# Patient Record
Sex: Female | Born: 1955 | Race: Black or African American | Hispanic: No | Smoking: Former smoker
Health system: Southern US, Community
[De-identification: ages and names within clinical notes are randomized; demographics above are authoritative.]

## PROBLEM LIST (undated history)

## (undated) DIAGNOSIS — F32A Depression, unspecified: Secondary | ICD-10-CM

## (undated) DIAGNOSIS — M1990 Unspecified osteoarthritis, unspecified site: Secondary | ICD-10-CM

## (undated) DIAGNOSIS — J302 Other seasonal allergic rhinitis: Secondary | ICD-10-CM

## (undated) DIAGNOSIS — C50919 Malignant neoplasm of unspecified site of unspecified female breast: Secondary | ICD-10-CM

## (undated) DIAGNOSIS — F419 Anxiety disorder, unspecified: Secondary | ICD-10-CM

## (undated) DIAGNOSIS — I10 Essential (primary) hypertension: Secondary | ICD-10-CM

## (undated) DIAGNOSIS — K219 Gastro-esophageal reflux disease without esophagitis: Secondary | ICD-10-CM

## (undated) DIAGNOSIS — F329 Major depressive disorder, single episode, unspecified: Secondary | ICD-10-CM

## (undated) HISTORY — DX: Unspecified osteoarthritis, unspecified site: M19.90

## (undated) HISTORY — DX: Malignant neoplasm of unspecified site of unspecified female breast: C50.919

## (undated) HISTORY — PX: KNEE ARTHROSCOPY: SUR90

## (undated) HISTORY — PX: BACK SURGERY: SHX140

## (undated) HISTORY — DX: Depression, unspecified: F32.A

## (undated) HISTORY — DX: Gastro-esophageal reflux disease without esophagitis: K21.9

## (undated) HISTORY — DX: Other seasonal allergic rhinitis: J30.2

## (undated) HISTORY — DX: Anxiety disorder, unspecified: F41.9

## (undated) HISTORY — DX: Essential (primary) hypertension: I10

## (undated) HISTORY — DX: Major depressive disorder, single episode, unspecified: F32.9

---

## 1991-06-25 HISTORY — PX: OTHER SURGICAL HISTORY: SHX169

## 1996-06-24 HISTORY — PX: VAGINAL HYSTERECTOMY: SHX2639

## 1998-05-09 ENCOUNTER — Encounter: Admission: RE | Admit: 1998-05-09 | Discharge: 1998-07-28 | Payer: Self-pay | Admitting: Anesthesiology

## 1998-07-28 ENCOUNTER — Encounter: Admission: RE | Admit: 1998-07-28 | Discharge: 1998-10-26 | Payer: Self-pay | Admitting: Anesthesiology

## 1999-01-05 ENCOUNTER — Encounter: Admission: RE | Admit: 1999-01-05 | Discharge: 1999-01-22 | Payer: Self-pay | Admitting: Anesthesiology

## 1999-01-23 ENCOUNTER — Encounter: Admission: RE | Admit: 1999-01-23 | Discharge: 1999-04-23 | Payer: Self-pay | Admitting: Anesthesiology

## 1999-05-11 ENCOUNTER — Encounter: Admission: RE | Admit: 1999-05-11 | Discharge: 1999-07-06 | Payer: Self-pay | Admitting: Anesthesiology

## 1999-07-06 ENCOUNTER — Encounter: Admission: RE | Admit: 1999-07-06 | Discharge: 1999-09-14 | Payer: Self-pay | Admitting: Anesthesiology

## 2001-05-18 ENCOUNTER — Ambulatory Visit (HOSPITAL_COMMUNITY): Admission: RE | Admit: 2001-05-18 | Discharge: 2001-05-18 | Payer: Self-pay | Admitting: Internal Medicine

## 2001-12-31 ENCOUNTER — Ambulatory Visit (HOSPITAL_COMMUNITY): Admission: RE | Admit: 2001-12-31 | Discharge: 2001-12-31 | Payer: Self-pay | Admitting: Family Medicine

## 2001-12-31 ENCOUNTER — Encounter: Payer: Self-pay | Admitting: Family Medicine

## 2002-04-30 ENCOUNTER — Ambulatory Visit (HOSPITAL_COMMUNITY): Admission: RE | Admit: 2002-04-30 | Discharge: 2002-04-30 | Payer: Self-pay | Admitting: Family Medicine

## 2002-04-30 ENCOUNTER — Encounter: Payer: Self-pay | Admitting: Family Medicine

## 2005-02-18 ENCOUNTER — Ambulatory Visit: Payer: Self-pay | Admitting: Cardiology

## 2006-01-16 ENCOUNTER — Encounter: Admission: RE | Admit: 2006-01-16 | Discharge: 2006-01-16 | Payer: Self-pay | Admitting: Orthopedic Surgery

## 2008-06-24 HISTORY — PX: TRIGGER FINGER RELEASE: SHX641

## 2009-06-24 DIAGNOSIS — C50919 Malignant neoplasm of unspecified site of unspecified female breast: Secondary | ICD-10-CM

## 2009-06-24 HISTORY — PX: BREAST LUMPECTOMY: SHX2

## 2009-06-24 HISTORY — DX: Malignant neoplasm of unspecified site of unspecified female breast: C50.919

## 2010-04-11 ENCOUNTER — Encounter: Admission: RE | Admit: 2010-04-11 | Discharge: 2010-04-11 | Payer: Self-pay | Admitting: Family Medicine

## 2010-04-17 ENCOUNTER — Encounter: Admission: RE | Admit: 2010-04-17 | Discharge: 2010-04-17 | Payer: Self-pay | Admitting: Family Medicine

## 2010-06-28 ENCOUNTER — Ambulatory Visit
Admission: RE | Admit: 2010-06-28 | Discharge: 2010-07-24 | Payer: Self-pay | Source: Home / Self Care | Attending: Radiation Oncology | Admitting: Radiation Oncology

## 2010-07-25 ENCOUNTER — Ambulatory Visit: Payer: Self-pay | Admitting: Radiation Oncology

## 2010-08-06 ENCOUNTER — Ambulatory Visit: Payer: MEDICARE | Attending: Radiation Oncology | Admitting: Radiation Oncology

## 2010-08-06 DIAGNOSIS — C50919 Malignant neoplasm of unspecified site of unspecified female breast: Secondary | ICD-10-CM | POA: Insufficient documentation

## 2010-11-09 NOTE — Op Note (Signed)
[facility]  Patient:    Glenda [name], Glenda [name] Visit Number: [id number] MRN: [id number]          Service Type: END Location: DAY Attending Physician:  Jonathon Bellows Dictated by:   Roetta Sessions, M.D. Proc. Date: 05/18/01 Admit Date:  05/18/2001   CC:         Lucita Lora. Reuel Boom, M.D., [facility], [place], [place]   Operative Report  PROCEDURE:  Esophagogastroduodenoscopy with [facility] dilation.  ENDOSCOPIST:  Roetta Sessions, M.D.  INDICATION FOR PROCEDURE:  Patient is a 55 year old lady referred by Dr. Lucita Lora. Daniel to further evaluate esophageal dysphagia.  She has some reflux symptoms that are well-controlled on Protonix 40 mg orally daily.  EGD is now being done to further evaluate her dysphagia.  This approach has been discussed with Ms. Rosalie Gums previously and again today at the bedside.  The potential risks, benefits and alternatives have been reviewed, questions answered and she is agreeable.  Please see my dictated consultation note for information.  DESCRIPTION OF PROCEDURE:  O2 saturation, blood pressure, pulse and respirations were monitored throughout the entire procedure.  Conscious sedation:  Versed 4 mg IV, Demerol 75 mg IV in divided doses; Cetacaine Spray for topical oropharyngeal anesthesia.  INSTRUMENT:  Olympus diagnostic gastroscope.  FINDINGS:  Examination of the tubular esophagus revealed no abnormalities.  Stomach:  The gastric cavity was empty and insufflated well with air. Thorough examination of the gastric mucosa including a retroflexed view of the proximal stomach and esophagogastric junction demonstrated no abnormalities. Pyloric channel was patent and easily traversed.  Duodenum:  The bulb and second portion were well-seen and there were some posterior bulbar erosions but otherwise the mucosa through the second portion appeared normal.  THERAPEUTIC/DIAGNOSTIC MANEUVERS PERFORMED:  A 54-French Maloney dilator  was passed to full insertion with good patient tolerance.  There was distinct "pop" felt as the dilator was passed across the cricopharyngeus.  A look back revealed a slight amount of blood at the cricopharyngeus.  There was no mucosal tear.  Remainder of the esophagus and proximal stomach appeared normal.  The patient tolerated the procedure well and was reactive at endoscopy.  IMPRESSION:  Normal tubular esophagus.   Normal stomach. Posterior bulbar erosions of uncertain significance.  Remainder of bulb and second portion appeared normal.  Status post dilation as described above.  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  RECOMMENDATIONS: 1. Continue Protonix 40 mg daily for now. 2. We are going to draw H. pylori serologies. 3. Appointment to see [place] back in the office to assess her progress in four    weeks. Dictated by:   Roetta Sessions, M.D. Attending Physician:  Jonathon Bellows DD:  05/18/01 TD:  05/18/01 Job: [id number]

## 2011-06-25 HISTORY — PX: SHOULDER SURGERY: SHX246

## 2011-09-10 ENCOUNTER — Ambulatory Visit: Payer: Medicare Other | Attending: Orthopedic Surgery | Admitting: Physical Therapy

## 2011-09-10 DIAGNOSIS — M25519 Pain in unspecified shoulder: Secondary | ICD-10-CM | POA: Insufficient documentation

## 2011-09-10 DIAGNOSIS — IMO0001 Reserved for inherently not codable concepts without codable children: Secondary | ICD-10-CM | POA: Insufficient documentation

## 2011-09-10 DIAGNOSIS — M25619 Stiffness of unspecified shoulder, not elsewhere classified: Secondary | ICD-10-CM | POA: Insufficient documentation

## 2011-09-12 ENCOUNTER — Ambulatory Visit: Payer: Medicare Other | Admitting: Physical Therapy

## 2011-09-16 ENCOUNTER — Ambulatory Visit: Payer: Medicare Other | Admitting: Physical Therapy

## 2011-09-18 ENCOUNTER — Ambulatory Visit: Payer: Medicare Other | Admitting: Physical Therapy

## 2011-09-23 ENCOUNTER — Ambulatory Visit: Payer: Medicare Other | Attending: Orthopedic Surgery | Admitting: Physical Therapy

## 2011-09-23 DIAGNOSIS — IMO0001 Reserved for inherently not codable concepts without codable children: Secondary | ICD-10-CM | POA: Insufficient documentation

## 2011-09-23 DIAGNOSIS — M25619 Stiffness of unspecified shoulder, not elsewhere classified: Secondary | ICD-10-CM | POA: Insufficient documentation

## 2011-09-23 DIAGNOSIS — M25519 Pain in unspecified shoulder: Secondary | ICD-10-CM | POA: Insufficient documentation

## 2011-09-24 ENCOUNTER — Ambulatory Visit: Payer: Medicare Other | Admitting: Physical Therapy

## 2011-09-30 ENCOUNTER — Encounter: Payer: Medicare Other | Admitting: Physical Therapy

## 2011-10-02 ENCOUNTER — Encounter: Payer: Medicare Other | Admitting: Physical Therapy

## 2011-10-08 ENCOUNTER — Encounter: Payer: Medicare Other | Admitting: Physical Therapy

## 2011-10-09 ENCOUNTER — Encounter: Payer: Medicare Other | Admitting: Physical Therapy

## 2011-10-10 ENCOUNTER — Encounter: Payer: Self-pay | Admitting: Internal Medicine

## 2011-11-20 ENCOUNTER — Ambulatory Visit (AMBULATORY_SURGERY_CENTER): Payer: Medicare Other | Admitting: *Deleted

## 2011-11-20 ENCOUNTER — Encounter: Payer: Self-pay | Admitting: Internal Medicine

## 2011-11-20 VITALS — Ht 64.0 in | Wt 180.2 lb

## 2011-11-20 DIAGNOSIS — Z1211 Encounter for screening for malignant neoplasm of colon: Secondary | ICD-10-CM

## 2011-11-20 MED ORDER — PEG-KCL-NACL-NASULF-NA ASC-C 100 G PO SOLR: 1.0000 | Freq: Once | ORAL | Status: DC

## 2011-12-04 ENCOUNTER — Encounter: Payer: Self-pay | Admitting: Internal Medicine

## 2011-12-04 ENCOUNTER — Ambulatory Visit (AMBULATORY_SURGERY_CENTER): Payer: Medicare Other | Admitting: Internal Medicine

## 2011-12-04 VITALS — BP 126/67 | HR 68 | Temp 98.7°F | Resp 18 | Ht 64.0 in | Wt 180.0 lb

## 2011-12-04 DIAGNOSIS — Z1211 Encounter for screening for malignant neoplasm of colon: Secondary | ICD-10-CM

## 2011-12-04 MED ORDER — SODIUM CHLORIDE 0.9 % IV SOLN: 500.0000 mL | INTRAVENOUS | Status: AC

## 2011-12-04 NOTE — Progress Notes (Signed)
Patient did not experience any of the following events: a burn prior to discharge; a fall within the facility; wrong site/side/patient/procedure/implant event; or a hospital transfer or hospital admission upon discharge from the facility. (G8907) Patient did not have preoperative order for IV antibiotic SSI prophylaxis. (G8918)  

## 2011-12-04 NOTE — Patient Instructions (Addendum)
Discharge instructions given with verbal understanding. Resume previous medications.YOU HAD AN ENDOSCOPIC PROCEDURE TODAY AT THE [facility]: Refer to the procedure report that was given to you for any specific questions about what was found during the examination.  If the procedure report does not answer your questions, please call your gastroenterologist to clarify.  If you requested that your care partner not be given the details of your procedure findings, then the procedure report has been included in a sealed envelope for you to review at your convenience later.  YOU SHOULD EXPECT: Some feelings of bloating in the abdomen. Passage of more gas than usual.  Walking can help get rid of the air that was put into your GI tract during the procedure and reduce the bloating. If you had a lower endoscopy (such as a colonoscopy or flexible sigmoidoscopy) you may notice spotting of blood in your stool or on the toilet paper. If you underwent a bowel prep for your procedure, then you may not have a normal bowel movement for a few days.  DIET: Your first meal following the procedure should be a light meal and then it is ok to progress to your normal diet.  A half-sandwich or bowl of soup is an example of a good first meal.  Heavy or fried foods are harder to digest and may make you feel nauseous or bloated.  Likewise meals heavy in dairy and vegetables can cause extra gas to form and this can also increase the bloating.  Drink plenty of fluids but you should avoid alcoholic beverages for 24 hours.  ACTIVITY: Your care partner should take you home directly after the procedure.  You should plan to take it easy, moving slowly for the rest of the day.  You can resume normal activity the day after the procedure however you should NOT DRIVE or use heavy machinery for 24 hours (because of the sedation medicines used during the test).    SYMPTOMS TO REPORT IMMEDIATELY: A gastroenterologist can be reached at  any hour.  During normal business hours, 8:30 AM to 5:00 PM Monday through Friday, call [phone].  After hours and on weekends, please call the GI answering service at [phone] who will take a message and have the physician on call contact you.   Following lower endoscopy (colonoscopy or flexible sigmoidoscopy):  Excessive amounts of blood in the stool  Significant tenderness or worsening of abdominal pains  Swelling of the abdomen that is new, acute  Fever of 100F or higher  FOLLOW UP: If any biopsies were taken you will be contacted by phone or by letter within the next 1-3 weeks.  Call your gastroenterologist if you have not heard about the biopsies in 3 weeks.  Our staff will call the home number listed on your records the next business day following your procedure to check on you and address any questions or concerns that you may have at that time regarding the information given to you following your procedure. This is a courtesy call and so if there is no answer at the home number and we have not heard from you through the emergency physician on call, we will assume that you have returned to your regular daily activities without incident.  SIGNATURES/CONFIDENTIALITY: You and/or your care partner have signed paperwork which will be entered into your electronic medical record.  These signatures attest to the fact that that the information above on your After Visit Summary has been reviewed and is understood.    Full responsibility of the confidentiality of this discharge information lies with you and/or your care-partner.  

## 2011-12-04 NOTE — Op Note (Signed)
[facility] [address] [place], [place]  [address]  COLONOSCOPY PROCEDURE REPORT  PATIENT:  Glenda, [name]  MR#:  [id number] BIRTHDATE:  [birth year], 55 yrs. old  GENDER:  female ENDOSCOPIST:  Hedwig Morton. Juanda Chance, MD REF. BY:  Donzetta Sprung, M.D. PROCEDURE DATE:  12/04/2011 PROCEDURE:  Colonoscopy 72536 ASA CLASS:  Class I INDICATIONS:  colorectal cancer screening, average risk MEDICATIONS:   MAC sedation, administered by CRNA, propofol (Diprivan) 150 mg  DESCRIPTION OF PROCEDURE:   After the risks and benefits and of the procedure were explained, informed consent was obtained. Digital rectal exam was performed and revealed no rectal masses. The LB [id number] endoscope was introduced through the anus and advanced to the cecum, which was identified by both the appendix and ileocecal valve.  The quality of the prep was good, using MoviPrep.  The instrument was then slowly withdrawn as the colon was fully examined. <<PROCEDUREIMAGES>>  FINDINGS:  No polyps or cancers were seen (see image1, image2, image4, image5, and image6).   Retroflexed views in the rectum revealed no abnormalities.    The scope was then withdrawn from the patient and the procedure completed.  COMPLICATIONS:  None ENDOSCOPIC IMPRESSION: 1) No polyps or cancers 2) Normal colonoscopy RECOMMENDATIONS: 1) High fiber diet.  REPEAT EXAM:  In 10 year(s) for.  ______________________________ Hedwig Morton. Juanda Chance, MD  CC:  n. eSIGNED:   Hedwig Morton. Medhansh Brinkmeier at 12/04/2011 10:00 AM  Stacy Gardner, [phone]

## 2011-12-05 ENCOUNTER — Telehealth: Payer: Self-pay | Admitting: *Deleted

## 2011-12-05 NOTE — Telephone Encounter (Signed)
  Follow up Call-  Call back number 12/04/2011  Post procedure Call Back phone  # [phone]  Permission to leave phone message Yes     Patient questions:  Do you have a fever, pain , or abdominal swelling? no Pain Score  0 *  Have you tolerated food without any problems? yes  Have you been able to return to your normal activities? yes  Do you have any questions about your discharge instructions: Diet   no Medications  no Follow up visit  no  Do you have questions or concerns about your Care? no  Actions: * If pain score is 4 or above: No action needed, pain <4.

## 2012-04-14 ENCOUNTER — Encounter (INDEPENDENT_AMBULATORY_CARE_PROVIDER_SITE_OTHER): Payer: Medicare Other | Admitting: Internal Medicine

## 2012-04-14 DIAGNOSIS — M949 Disorder of cartilage, unspecified: Secondary | ICD-10-CM

## 2012-04-14 DIAGNOSIS — C50919 Malignant neoplasm of unspecified site of unspecified female breast: Secondary | ICD-10-CM

## 2012-04-14 DIAGNOSIS — M899 Disorder of bone, unspecified: Secondary | ICD-10-CM

## 2012-04-14 DIAGNOSIS — Z79810 Long term (current) use of selective estrogen receptor modulators (SERMs): Secondary | ICD-10-CM

## 2012-10-07 ENCOUNTER — Encounter (INDEPENDENT_AMBULATORY_CARE_PROVIDER_SITE_OTHER): Payer: Medicare Other | Admitting: Internal Medicine

## 2012-10-07 DIAGNOSIS — M899 Disorder of bone, unspecified: Secondary | ICD-10-CM

## 2012-10-07 DIAGNOSIS — M949 Disorder of cartilage, unspecified: Secondary | ICD-10-CM

## 2012-10-07 DIAGNOSIS — C50919 Malignant neoplasm of unspecified site of unspecified female breast: Secondary | ICD-10-CM

## 2013-04-15 ENCOUNTER — Encounter (INDEPENDENT_AMBULATORY_CARE_PROVIDER_SITE_OTHER): Payer: Medicare Other

## 2013-04-15 DIAGNOSIS — M949 Disorder of cartilage, unspecified: Secondary | ICD-10-CM

## 2013-04-15 DIAGNOSIS — M899 Disorder of bone, unspecified: Secondary | ICD-10-CM

## 2013-04-15 DIAGNOSIS — C50919 Malignant neoplasm of unspecified site of unspecified female breast: Secondary | ICD-10-CM

## 2013-06-04 ENCOUNTER — Encounter: Payer: Self-pay | Admitting: *Deleted

## 2013-06-04 ENCOUNTER — Telehealth: Payer: Self-pay | Admitting: *Deleted

## 2013-06-04 NOTE — Progress Notes (Signed)
Received information back from Dr. Darnelle Catalan and he stated to schedule her as normal and to see if Dr. Welton Flakes had availability to see her first.  Took paperwork to Dr. Welton Flakes for an appt date and time.

## 2013-06-04 NOTE — Progress Notes (Signed)
Received a referral from Tiffany and I have sent paperwork to Dr. Darnelle Catalan so I can schedule her appropriately.

## 2013-06-04 NOTE — Telephone Encounter (Signed)
Received an appt from Dr. Welton Flakes.  I called and confirmed 06/08/13 appt w/ pt.  Unable to make the [facility] appt so I called Dory Peru and she will make this one for me.  I mailed before appt letter, welcome packet & intake form to pt.  Took paperwork to Med [facility] for chart.

## 2013-06-07 ENCOUNTER — Encounter: Payer: Self-pay | Admitting: *Deleted

## 2013-06-07 ENCOUNTER — Other Ambulatory Visit: Payer: Self-pay | Admitting: *Deleted

## 2013-06-07 DIAGNOSIS — C50212 Malignant neoplasm of upper-inner quadrant of left female breast: Secondary | ICD-10-CM | POA: Insufficient documentation

## 2013-06-07 NOTE — Progress Notes (Signed)
Completed chart and gave to [facility] to enter labs and either give back to me or place in Dr. Milta Deiters box.

## 2013-06-07 NOTE — Progress Notes (Signed)
Received chart back from Dawn and placed in Dr. Khan's box. 

## 2013-06-08 ENCOUNTER — Ambulatory Visit: Payer: Self-pay

## 2013-06-08 ENCOUNTER — Ambulatory Visit (HOSPITAL_BASED_OUTPATIENT_CLINIC_OR_DEPARTMENT_OTHER): Payer: Medicare Other | Admitting: Oncology

## 2013-06-08 ENCOUNTER — Telehealth: Payer: Self-pay | Admitting: Oncology

## 2013-06-08 ENCOUNTER — Telehealth: Payer: Self-pay | Admitting: *Deleted

## 2013-06-08 ENCOUNTER — Other Ambulatory Visit (HOSPITAL_BASED_OUTPATIENT_CLINIC_OR_DEPARTMENT_OTHER): Payer: Medicare Other

## 2013-06-08 VITALS — BP 103/66 | HR 69 | Temp 98.3°F | Resp 20 | Ht 64.0 in | Wt 161.5 lb

## 2013-06-08 DIAGNOSIS — C50919 Malignant neoplasm of unspecified site of unspecified female breast: Secondary | ICD-10-CM

## 2013-06-08 DIAGNOSIS — M8580 Other specified disorders of bone density and structure, unspecified site: Secondary | ICD-10-CM

## 2013-06-08 DIAGNOSIS — C50912 Malignant neoplasm of unspecified site of left female breast: Secondary | ICD-10-CM

## 2013-06-08 DIAGNOSIS — Z170 Estrogen receptor positive status [ER+]: Secondary | ICD-10-CM

## 2013-06-08 DIAGNOSIS — C50219 Malignant neoplasm of upper-inner quadrant of unspecified female breast: Secondary | ICD-10-CM

## 2013-06-08 DIAGNOSIS — E559 Vitamin D deficiency, unspecified: Secondary | ICD-10-CM

## 2013-06-08 DIAGNOSIS — C50212 Malignant neoplasm of upper-inner quadrant of left female breast: Secondary | ICD-10-CM

## 2013-06-08 LAB — CBC WITH DIFFERENTIAL/PLATELET
HCT: 35.8 % (ref 34.8–46.6)
LYMPH%: 43.1 % (ref 14.0–49.7)
MCHC: 34 g/dL (ref 31.5–36.0)
MCV: 88.3 fL (ref 79.5–101.0)
MONO#: 0.3 10*3/uL (ref 0.1–0.9)
NEUT#: 2.1 10*3/uL (ref 1.5–6.5)
Platelets: 198 10*3/uL (ref 145–400)
RBC: 4.05 10*6/uL (ref 3.70–5.45)
RDW: 13.3 % (ref 11.2–14.5)
WBC: 4.4 10*3/uL (ref 3.9–10.3)
lymph#: 1.9 10*3/uL (ref 0.9–3.3)

## 2013-06-08 LAB — COMPREHENSIVE METABOLIC PANEL (CC13)
Albumin: 3.8 g/dL (ref 3.5–5.0)
Anion Gap: 7 mEq/L (ref 3–11)
BUN: 10.1 mg/dL (ref 7.0–26.0)
CO2: 27 mEq/L (ref 22–29)
Glucose: 100 mg/dl (ref 70–140)
Sodium: 141 mEq/L (ref 136–145)

## 2013-06-08 MED ORDER — EXEMESTANE 25 MG PO TABS: 25.0000 mg | ORAL_TABLET | Freq: Every day | ORAL | Status: DC

## 2013-06-08 NOTE — Telephone Encounter (Signed)
Called and spoke with patient and rescheduled her appt. For 1pm instead of 130pm.

## 2013-06-08 NOTE — Telephone Encounter (Signed)
, °

## 2013-06-08 NOTE — Patient Instructions (Signed)
Exemestane tablets  What is this medicine?  EXEMESTANE (ex e MES tane) blocks the production of the hormone estrogen. Some types of breast cancer depend on estrogen to grow, and this medicine can stop tumor growth by blocking estrogen production. This medicine is for the treatment of breast cancer in postmenopausal women only.  This medicine may be used for other purposes; ask your health care provider or pharmacist if you have questions.  COMMON BRAND NAME(S): Aromasin  What should I tell my health care provider before I take this medicine?  They need to know if you have any of these conditions:  -an unusual or allergic reaction to exemestane, other medicines, foods, dyes, or preservatives  -pregnant or trying to get pregnant  -breast-feeding  How should I use this medicine?  Take this medicine by mouth with a glass of water. Follow the directions on the prescription label. Take your doses at regular intervals after a meal. Do not take your medicine more often than directed. Do not stop taking except on the advice of your doctor or health care professional.  Contact your pediatrician regarding the use of this medicine in children. Special care may be needed.  Overdosage: If you think you have taken too much of this medicine contact a poison control center or emergency room at once.  NOTE: This medicine is only for you. Do not share this medicine with others.  What if I miss a dose?  If you miss a dose, take the next dose as usual. Do not try to make up the missed dose. Do not take double or extra doses.  What may interact with this medicine?  Do not take this medicine with any of the following medications:  -female hormones, like estrogens and birth control pills  This medicine may also interact with the following medications:  -androstenedione  -phenytoin  -rifabutin, rifampin, or rifapentine  -St. John's Wort  This list may not describe all possible interactions. Give your health care provider a list of all the  medicines, herbs, non-prescription drugs, or dietary supplements you use. Also tell them if you smoke, drink alcohol, or use illegal drugs. Some items may interact with your medicine.  What should I watch for while using this medicine?  Visit your doctor or health care professional for regular checks on your progress.  If you experience hot flashes or sweating while taking this medicine, avoid alcohol, smoking and drinks with caffeine. This may help to decrease these side effects.  What side effects may I notice from receiving this medicine?  Side effects that you should report to your doctor or health care professional as soon as possible:  -any new or unusual symptoms  -changes in vision  -fever  -leg or arm swelling  -pain in bones, joints, or muscles  -pain in hips, back, ribs, arms, shoulders, or legs  Side effects that usually do not require medical attention (report to your doctor or health care professional if they continue or are bothersome):  -difficulty sleeping  -headache  -hot flashes  -sweating  -unusually weak or tired  This list may not describe all possible side effects. Call your doctor for medical advice about side effects. You may report side effects to FDA at 1-800-FDA-1088.  Where should I keep my medicine?  Keep out of the reach of children.  Store at room temperature between 15 and 30 degrees C (59 and 86 degrees F). Throw away any unused medicine after the expiration date.  NOTE: This sheet   is a summary. It may not cover all possible information. If you have questions about this medicine, talk to your doctor, pharmacist, or health care provider.   2014, Elsevier/Gold Standard. (2007-10-13 11:48:29)

## 2013-06-11 NOTE — Progress Notes (Signed)
OFFICE PROGRESS NOTE  CCDonzetta Sprung, MD [address] 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 She then received radiation therapy by Dr. Kathrynn Running. Thereafter patient was begun on tamoxifen 20 mg daily. However she is also on Zoloft.    #2 patient had been followed at [facility] but now she is transferring her care to medical oncology.  CURRENT THERAPY:Tamoxifen  INTERVAL HISTORY: Glenda [name] 57 y.o. female returns for followup visit today. Overall she seems to be doing well she denies any fevers chills night sweats headaches shortness of breath chest pains palpitations .She denies any fevers chills night sweats headaches shortness of breath palpitations no myalgias and arthralgias no peripheral paresthesias no bleeding problems. Remainder of the 10 point review of systems is negative.  MEDICAL HISTORY: Past Medical History  Diagnosis Date  . Seasonal allergies   . Anxiety   . Arthritis     right shoulder  . Breast cancer 2011    left breast  . Depression   . GERD (gastroesophageal reflux disease)   . Hypertension     ALLERGIES:  is allergic to sulfa antibiotics.  MEDICATIONS:  Current Outpatient Prescriptions  Medication Sig Dispense Refill  . amLODipine-valsartan (EXFORGE) 5-160 MG per tablet Take 1 tablet by mouth daily.      . cetirizine (ZYRTEC) 10 MG tablet Take 10 mg by mouth daily as needed.      . Cyanocobalamin (VITAMIN B 12 PO) Take 500 mg by mouth daily.      . fluticasone (FLOVENT DISKUS) 50 MCG/BLIST diskus inhaler Inhale 2 puffs into the lungs daily.      Marland Kitchen HYDROcodone-acetaminophen (NORCO) 5-325 MG per tablet Take 1 tablet by mouth every 6 (six) hours as needed.      Marland Kitchen ibuprofen  (ADVIL,MOTRIN) 200 MG tablet Take 200 mg by mouth every 6 (six) hours as needed.      . ranitidine (ZANTAC) 150 MG capsule Take 150 mg by mouth daily as needed.      . sertraline (ZOLOFT) 100 MG tablet Take 100 mg by mouth 2 (two) times daily.      Marland Kitchen tiZANidine (ZANAFLEX) 4 MG tablet Take 4 mg by mouth every 6 (six) hours as needed.      . traZODone (DESYREL) 100 MG tablet Take 200 mg by mouth at bedtime.      Marland Kitchen exemestane (AROMASIN) 25 MG tablet Take 1 tablet (25 mg total) by mouth daily after breakfast.  90 tablet  12   Current Facility-Administered Medications  Medication Dose Route Frequency Provider Last Rate Last Dose  . 0.9 %  sodium chloride infusion  500 mL Intravenous Continuous Hart Carwin, MD        SURGICAL HISTORY:  Past Surgical History  Procedure Laterality Date  . Breast lumpectomy  2011  . Shoulder surgery  2013  . Vaginal hysterectomy  1998  . Trigger finger release  2010    left thumb  . Knee arthroscopy  2000, 1990    right and left  . Back surgery  1985, 1992  . Breast biopsy   1993    REVIEW OF SYSTEMS:  Pertinent items are noted in HPI.   HEALTH MAINTENANCE:   PHYSICAL EXAMINATION: Blood pressure 103/66, pulse 69, temperature 98.3 F (  36.8 C), temperature source Oral, resp. rate 20, height 5\' 4"  (1.626 m), weight 161 lb 8 oz (73.256 kg). Body mass index is 27.71 kg/(m^2). ECOG PERFORMANCE STATUS: 0 - Asymptomatic   General appearance: alert, cooperative and appears stated age Neck: no adenopathy, no carotid bruit, no JVD, supple, symmetrical, trachea midline and thyroid not enlarged, symmetric, no tenderness/mass/nodules Lymph nodes: Cervical, supraclavicular, and axillary nodes normal. Resp: clear to auscultation bilaterally Back: symmetric, no curvature. ROM normal. No CVA tenderness. Cardio: regular rate and rhythm GI: soft, non-tender; bowel sounds normal; no masses,  no organomegaly Extremities: extremities normal, atraumatic, no cyanosis or  edema Neurologic: Alert and oriented X 3, normal strength and tone. Normal symmetric reflexes. Normal coordination and gait   LABORATORY DATA: Lab Results  Component Value Date   WBC 4.4 06/08/2013   HGB 12.2 06/08/2013   HCT 35.8 06/08/2013   MCV 88.3 06/08/2013   PLT 198 06/08/2013      Chemistry      Component Value Date/Time   NA 141 06/08/2013 1333   K 4.2 06/08/2013 1333   CO2 27 06/08/2013 1333   BUN 10.1 06/08/2013 1333   CREATININE 0.8 06/08/2013 1333      Component Value Date/Time   CALCIUM 9.2 06/08/2013 1333   ALKPHOS 86 06/08/2013 1333   AST 25 06/08/2013 1333   ALT 13 06/08/2013 1333   BILITOT 0.31 06/08/2013 1333       RADIOGRAPHIC STUDIES:  No results found.  ASSESSMENT/PLAN:: 57 year old female with  #1 stage I (T1 and her 0) invasive ductal carcinoma ER positive. She has been on tamoxifen 20 mg daily plan is for 5 years. However patient is on Zoloft also. She is questioning from drug interaction. I have recommended we switched to Aromasin since patient is postmenopausal.  #2 I will order patient's mammogram as well as her bone density scan.  #3 patient will be seen back in 6 months time for followup     All questions were answered. The patient knows to call the clinic with any problems, questions or concerns. We can certainly see the patient much sooner if necessary.  I spent 40 minutes counseling the patient face to face. The total time spent in the appointment was 40 minutes.    Drue Second, MD Medical/Oncology [facility] [phone] (beeper) [phone] (Office)  06/11/2013, 3:44 AM

## 2013-07-13 ENCOUNTER — Ambulatory Visit
Admission: RE | Admit: 2013-07-13 | Discharge: 2013-07-13 | Disposition: A | Discharge status: Home / Self Care | Payer: Medicare Other | Source: Ambulatory Visit | Attending: Oncology | Admitting: Oncology

## 2013-07-13 DIAGNOSIS — M8580 Other specified disorders of bone density and structure, unspecified site: Secondary | ICD-10-CM

## 2013-07-13 DIAGNOSIS — C50912 Malignant neoplasm of unspecified site of left female breast: Secondary | ICD-10-CM

## 2013-07-13 MED ORDER — ALENDRONATE SODIUM 35 MG PO TABS: 35.0000 mg | ORAL_TABLET | ORAL | Status: DC

## 2013-07-13 NOTE — Progress Notes (Signed)
Quick Note:  Please call patient:   Bone density shows low bone mass   begin fosamax 1 q weekly  Prescription sent to her pharamcy ______

## 2013-10-12 ENCOUNTER — Telehealth: Payer: Self-pay | Admitting: Oncology

## 2013-10-12 NOTE — Telephone Encounter (Signed)
, °

## 2013-10-20 ENCOUNTER — Other Ambulatory Visit: Payer: Self-pay

## 2013-10-20 ENCOUNTER — Ambulatory Visit: Payer: Self-pay | Admitting: Oncology

## 2013-10-28 ENCOUNTER — Telehealth: Payer: Self-pay | Admitting: Hematology and Oncology

## 2013-10-28 ENCOUNTER — Ambulatory Visit (HOSPITAL_BASED_OUTPATIENT_CLINIC_OR_DEPARTMENT_OTHER): Payer: Medicare Other | Admitting: Hematology and Oncology

## 2013-10-28 ENCOUNTER — Other Ambulatory Visit (HOSPITAL_BASED_OUTPATIENT_CLINIC_OR_DEPARTMENT_OTHER): Payer: Medicare Other

## 2013-10-28 VITALS — BP 114/69 | HR 68 | Temp 98.6°F | Resp 18 | Ht 64.0 in | Wt 167.1 lb

## 2013-10-28 DIAGNOSIS — Z170 Estrogen receptor positive status [ER+]: Secondary | ICD-10-CM

## 2013-10-28 DIAGNOSIS — E559 Vitamin D deficiency, unspecified: Secondary | ICD-10-CM

## 2013-10-28 DIAGNOSIS — M2550 Pain in unspecified joint: Secondary | ICD-10-CM

## 2013-10-28 DIAGNOSIS — M899 Disorder of bone, unspecified: Secondary | ICD-10-CM

## 2013-10-28 DIAGNOSIS — C50212 Malignant neoplasm of upper-inner quadrant of left female breast: Secondary | ICD-10-CM

## 2013-10-28 DIAGNOSIS — N951 Menopausal and female climacteric states: Secondary | ICD-10-CM

## 2013-10-28 DIAGNOSIS — C50919 Malignant neoplasm of unspecified site of unspecified female breast: Secondary | ICD-10-CM

## 2013-10-28 DIAGNOSIS — M949 Disorder of cartilage, unspecified: Secondary | ICD-10-CM

## 2013-10-28 DIAGNOSIS — C50912 Malignant neoplasm of unspecified site of left female breast: Secondary | ICD-10-CM

## 2013-10-28 LAB — COMPREHENSIVE METABOLIC PANEL (CC13)
ALK PHOS: 101 U/L (ref 40–150)
ALT: 12 U/L (ref 0–55)
AST: 24 U/L (ref 5–34)
Albumin: 3.8 g/dL (ref 3.5–5.0)
Anion Gap: 8 mEq/L (ref 3–11)
BUN: 8.1 mg/dL (ref 7.0–26.0)
CHLORIDE: 109 meq/L (ref 98–109)
CO2: 26 meq/L (ref 22–29)
CREATININE: 0.8 mg/dL (ref 0.6–1.1)
Calcium: 9.8 mg/dL (ref 8.4–10.4)
Glucose: 95 mg/dl (ref 70–140)
Potassium: 4.3 mEq/L (ref 3.5–5.1)
Sodium: 143 mEq/L (ref 136–145)
TOTAL PROTEIN: 7.1 g/dL (ref 6.4–8.3)
Total Bilirubin: 0.41 mg/dL (ref 0.20–1.20)

## 2013-10-28 LAB — CBC WITH DIFFERENTIAL/PLATELET
BASO%: 0.6 % (ref 0.0–2.0)
Basophils Absolute: 0 10*3/uL (ref 0.0–0.1)
EOS%: 1.6 % (ref 0.0–7.0)
Eosinophils Absolute: 0.1 10*3/uL (ref 0.0–0.5)
HEMATOCRIT: 35.2 % (ref 34.8–46.6)
HEMOGLOBIN: 12 g/dL (ref 11.6–15.9)
LYMPH#: 1.3 10*3/uL (ref 0.9–3.3)
LYMPH%: 42.6 % (ref 14.0–49.7)
MCH: 29 pg (ref 25.1–34.0)
MCHC: 34.1 g/dL (ref 31.5–36.0)
MCV: 85 fL (ref 79.5–101.0)
MONO#: 0.3 10*3/uL (ref 0.1–0.9)
MONO%: 9 % (ref 0.0–14.0)
NEUT#: 1.4 10*3/uL — ABNORMAL LOW (ref 1.5–6.5)
NEUT%: 46.2 % (ref 38.4–76.8)
PLATELETS: 193 10*3/uL (ref 145–400)
RBC: 4.14 10*6/uL (ref 3.70–5.45)
RDW: 13.4 % (ref 11.2–14.5)
WBC: 3.1 10*3/uL — AB (ref 3.9–10.3)

## 2013-10-28 NOTE — Progress Notes (Signed)
OFFICE PROGRESS NOTE  CCGar Ponto, MD [address] 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 She then received radiation therapy by Glenda [name] [name]. Thereafter patient was begun on tamoxifen 20 mg daily. However she is also on Zoloft.   #2 patient had been followed at [facility] but now she is transferring her care to medical oncology.  CURRENT THERAPY:Tamoxifen started in 2012  INTERVAL HISTORY: Glenda [name] [name] 58 y.o. female returns for followup visit today. She complains of hot flushes and joint pains on tamoxifen. She says those are tolerable. she takes calcium with vitamin D supplementation for osteopenia.  she says that she has not gotten any Fosamax but in the system it was prescribed on 07/13/2013 after review of DEXA scan which was done in January 2015. Overall she seems to be doing well she denies any fevers chills night sweats headaches shortness of breath chest pains palpitations. Remainder of the 10 point review of systems is negative.  MEDICAL HISTORY: Past Medical History  Diagnosis Date  . Seasonal allergies   . Anxiety   . Arthritis     right shoulder  . Breast cancer 2011    left breast  . Depression   . GERD (gastroesophageal reflux disease)   . Hypertension     ALLERGIES:  is allergic to sulfa antibiotics.  MEDICATIONS:  Current Outpatient Prescriptions  Medication Sig Dispense Refill  . amLODipine-valsartan (EXFORGE) 5-160 MG per tablet Take 1 tablet by mouth daily.      . cetirizine (ZYRTEC) 10 MG tablet Take 10 mg by mouth daily as needed.      Marland Kitchen exemestane (AROMASIN) 25 MG tablet Take 1 tablet (25 mg total) by mouth daily after breakfast.  90 tablet  12  . fluticasone (FLOVENT DISKUS) 50 MCG/BLIST diskus  inhaler Inhale 2 puffs into the lungs daily.      Marland Kitchen HYDROcodone-acetaminophen (NORCO) 5-325 MG per tablet Take 1 tablet by mouth every 6 (six) hours as needed.      Marland Kitchen omeprazole (PRILOSEC) 20 MG capsule Take 20 mg by mouth daily.      . sertraline (ZOLOFT) 100 MG tablet Take 100 mg by mouth 2 (two) times daily.      Marland Kitchen tiZANidine (ZANAFLEX) 4 MG tablet Take 4 mg by mouth every 6 (six) hours as needed.      . traZODone (DESYREL) 100 MG tablet Take 200 mg by mouth at bedtime.      Marland Kitchen alendronate (FOSAMAX) 35 MG tablet Take 1 tablet (35 mg total) by mouth every 7 (seven) days. Take with Glenda [name] full glass of water on an empty stomach.  12 tablet  1  . ibuprofen (ADVIL,MOTRIN) 200 MG tablet Take 200 mg by mouth every 6 (six) hours as needed.       Current Facility-Administered Medications  Medication Dose Route Frequency Provider Last Rate Last Dose  . 0.9 %  sodium chloride infusion  500 mL Intravenous Continuous Glenda [name] Dragon, MD        SURGICAL HISTORY:  Past Surgical History  Procedure Laterality Date  . Breast lumpectomy  2011  . Shoulder surgery  2013  . Vaginal hysterectomy  1998  . Trigger finger release  2010    left thumb  . Knee arthroscopy  2000, 1990  right and left  . Back surgery  1985, 1992  . Breast biopsy   1993    REVIEW OF SYSTEMS:   As mentioned in interval history     PHYSICAL EXAMINATION: Blood pressure 114/69, pulse 68, temperature 98.6 F (37 C), temperature source Oral, resp. rate 18, height 5\' 4"  (1.626 m), weight 167 lb 1.6 oz (75.796 kg). Body mass index is 28.67 kg/(m^2). ECOG PERFORMANCE STATUS: 0 - Asymptomatic   General appearance: alert, cooperative and appears stated age Neck: no adenopathy, no carotid bruit, no JVD, supple, symmetrical, trachea midline and thyroid not enlarged, symmetric, no tenderness/mass/nodules Lymph nodes: Cervical, supraclavicular, and axillary nodes normal. Resp: clear to auscultation bilaterally Back: symmetric, no curvature.  ROM normal. No CVA tenderness. Cardio: regular rate and rhythm GI: soft, non-tender; bowel sounds normal; no masses,  no organomegaly Extremities: extremities normal, atraumatic, no cyanosis or edema Neurologic: Alert and oriented X 3, normal strength and tone. Normal symmetric reflexes. Normal coordination and gait Breast examination: Left breast status post lumpectomy scar noted no masses felt. Right breast no masses felt in bilateral axillary no lymphadenopathy appreciated  LABORATORY DATA: Lab Results  Component Value Date   WBC 3.1* 10/28/2013   HGB 12.0 10/28/2013   HCT 35.2 10/28/2013   MCV 85.0 10/28/2013   PLT 193 10/28/2013      Chemistry      Component Value Date/Time   NA 143 10/28/2013 1009   K 4.3 10/28/2013 1009   CO2 26 10/28/2013 1009   BUN 8.1 10/28/2013 1009   CREATININE 0.8 10/28/2013 1009      Component Value Date/Time   CALCIUM 9.8 10/28/2013 1009   ALKPHOS 101 10/28/2013 1009   AST 24 10/28/2013 1009   ALT 12 10/28/2013 1009   BILITOT 0.41 10/28/2013 1009       RADIOGRAPHIC STUDIES:  No results found.  ASSESSMENT/PLAN:: 58 year old female with  #1. stage [name] (T1 and N 0) invasive ductal carcinoma ER positive. She was started on on tamoxifen 20 mg daily in January 2012. In view of concern of Zoloft interaction with the tamoxifen,  tamoxifen was discontinued and Aromasin was started in December 2014. Continue Aromasin 20 mg by mouth once daily for total of 5 years. Since there is no mammograms showed in the EPIC system since 2011 and she is due to get the mammogram, will obtain the bilateral mammogram. We'll obtain previous mammogram report from primary care physician's office   #2.  [name] have reviewed bone density result with the patient which was performed in January 2015 and that revealed low bone mass. Continue taking calcium with vitamin D supplementation. [name] have asked the patient to pick up the Fosamax prescription from the pharmacy and continue taking as indicated once  weekly  #3 patient will be seen back in 6 months time for followup   All questions were answered. The patient knows to call the clinic with any problems, questions or concerns. We can certainly see the patient much sooner if necessary.  [name] spent 20 minutes counseling the patient face to face. The total time spent in the appointment was 30 minutes.  Wilmon Arms, MD Medical/Oncology [facility] [phone] (Office)  10/28/2013, 6:35 PM

## 2013-10-28 NOTE — Telephone Encounter (Signed)
, °

## 2013-10-29 ENCOUNTER — Telehealth: Payer: Self-pay | Admitting: *Deleted

## 2013-10-29 LAB — VITAMIN D 25 HYDROXY (VIT D DEFICIENCY, FRACTURES): VIT D 25 HYDROXY: 31 ng/mL (ref 30–89)

## 2013-10-29 NOTE — Telephone Encounter (Signed)
Called [facility] for mammo report.  Received & placed on Dr. Lenoria Chime desk.

## 2013-11-02 ENCOUNTER — Telehealth: Payer: Self-pay | Admitting: *Deleted

## 2013-11-03 ENCOUNTER — Telehealth: Payer: Self-pay

## 2013-11-03 ENCOUNTER — Telehealth: Payer: Self-pay | Admitting: *Deleted

## 2013-11-03 DIAGNOSIS — C50212 Malignant neoplasm of upper-inner quadrant of left female breast: Secondary | ICD-10-CM

## 2013-11-03 MED ORDER — TAMOXIFEN CITRATE 20 MG PO TABS: 20.0000 mg | ORAL_TABLET | Freq: Every day | ORAL | Status: AC

## 2013-11-03 NOTE — Telephone Encounter (Signed)
Per Dr. Earnest Conroy, ok for patient to go back to Tamoxifen.   Pt reports she stopped taking aromasin several days ago d/t side effects.  Let pt know I would call in prescription for Tamoxifen to [facility] on [address].  Pt voiced understanding.

## 2013-11-03 NOTE — Telephone Encounter (Signed)
Aromasin is causing too much bone pain according to patient. She stopped drug few days ago. Wants to go back on Tamoxifen that was started Jan 2012. Was changed to Aromasin on December 2014 due to potential drug interaction with her Zoloft. Will review with provider and call her back.

## 2014-03-01 ENCOUNTER — Other Ambulatory Visit: Payer: Self-pay | Admitting: *Deleted

## 2014-03-01 DIAGNOSIS — C50212 Malignant neoplasm of upper-inner quadrant of left female breast: Secondary | ICD-10-CM

## 2014-03-02 ENCOUNTER — Telehealth: Payer: Self-pay | Admitting: Adult Health

## 2014-03-02 ENCOUNTER — Other Ambulatory Visit (HOSPITAL_BASED_OUTPATIENT_CLINIC_OR_DEPARTMENT_OTHER): Payer: Medicare Other

## 2014-03-02 ENCOUNTER — Ambulatory Visit (HOSPITAL_BASED_OUTPATIENT_CLINIC_OR_DEPARTMENT_OTHER): Payer: Medicare Other | Admitting: Adult Health

## 2014-03-02 ENCOUNTER — Encounter: Payer: Self-pay | Admitting: Adult Health

## 2014-03-02 VITALS — BP 115/68 | HR 59 | Temp 98.4°F | Resp 20 | Ht 64.0 in | Wt 160.1 lb

## 2014-03-02 DIAGNOSIS — E559 Vitamin D deficiency, unspecified: Secondary | ICD-10-CM

## 2014-03-02 DIAGNOSIS — M899 Disorder of bone, unspecified: Secondary | ICD-10-CM

## 2014-03-02 DIAGNOSIS — Z853 Personal history of malignant neoplasm of breast: Secondary | ICD-10-CM

## 2014-03-02 DIAGNOSIS — M8580 Other specified disorders of bone density and structure, unspecified site: Secondary | ICD-10-CM

## 2014-03-02 DIAGNOSIS — C50212 Malignant neoplasm of upper-inner quadrant of left female breast: Secondary | ICD-10-CM

## 2014-03-02 DIAGNOSIS — M949 Disorder of cartilage, unspecified: Secondary | ICD-10-CM

## 2014-03-02 LAB — COMPREHENSIVE METABOLIC PANEL (CC13)
ALT: 7 U/L (ref 0–55)
AST: 20 U/L (ref 5–34)
Albumin: 3.7 g/dL (ref 3.5–5.0)
Alkaline Phosphatase: 71 U/L (ref 40–150)
Anion Gap: 7 mEq/L (ref 3–11)
BILIRUBIN TOTAL: 0.32 mg/dL (ref 0.20–1.20)
BUN: 9 mg/dL (ref 7.0–26.0)
CALCIUM: 9.3 mg/dL (ref 8.4–10.4)
CHLORIDE: 111 meq/L — AB (ref 98–109)
CO2: 24 mEq/L (ref 22–29)
CREATININE: 0.8 mg/dL (ref 0.6–1.1)
GLUCOSE: 94 mg/dL (ref 70–140)
Potassium: 4.2 mEq/L (ref 3.5–5.1)
Sodium: 142 mEq/L (ref 136–145)
Total Protein: 7.1 g/dL (ref 6.4–8.3)

## 2014-03-02 LAB — CBC WITH DIFFERENTIAL/PLATELET
BASO%: 1.4 % (ref 0.0–2.0)
Basophils Absolute: 0.1 10*3/uL (ref 0.0–0.1)
EOS%: 0.7 % (ref 0.0–7.0)
Eosinophils Absolute: 0 10*3/uL (ref 0.0–0.5)
HEMATOCRIT: 38 % (ref 34.8–46.6)
HGB: 12.2 g/dL (ref 11.6–15.9)
LYMPH#: 1.7 10*3/uL (ref 0.9–3.3)
LYMPH%: 42.8 % (ref 14.0–49.7)
MCH: 28.5 pg (ref 25.1–34.0)
MCHC: 32.3 g/dL (ref 31.5–36.0)
MCV: 88.4 fL (ref 79.5–101.0)
MONO#: 0.3 10*3/uL (ref 0.1–0.9)
MONO%: 7.7 % (ref 0.0–14.0)
NEUT%: 47.4 % (ref 38.4–76.8)
NEUTROS ABS: 1.8 10*3/uL (ref 1.5–6.5)
PLATELETS: 204 10*3/uL (ref 145–400)
RBC: 4.29 10*6/uL (ref 3.70–5.45)
RDW: 13.5 % (ref 11.2–14.5)
WBC: 3.9 10*3/uL (ref 3.9–10.3)

## 2014-03-02 NOTE — Telephone Encounter (Signed)
, °

## 2014-03-02 NOTE — Progress Notes (Signed)
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 She then received radiation therapy by Dr. Tammi Klippel. Thereafter patient was begun on tamoxifen 20 mg daily. However she is also on Zoloft.  Was switched to Aromasin at some point but couldn't tolerate it and then restarted Tamoxifen.   #2 patient had been followed at [facility] but now she is transferring her care to medical oncology.  CURRENT THERAPY:Tamoxifen started in 2012  INTERVAL HISTORY: Glenda [name] 58 y.o. female with h/o left breast cancer here for follow up.  She was switched to Aromasin due to a possible Tamoxifen and Zoloft interaction.  She stayed on Aromasin for about 6-7 months and stopped it due to severe pain and she could hardly walk.  She then talked to her PCP Dr. Gar Ponto who instructed her to restart the Tamoxifen and stop the Aromasin.  She has done this.  She feels much better since switching back.  She denies new pain, headaches, weakness, numbness, bowel, bladder changes.  She does occasionally have dry eyes and uses OTC eye gtts which help.  She denies joint aches, vaginal dryness, vaginal discharge/bleeding, swelling, or any further concerns.  We reviewed her health maintenance below.   MEDICAL HISTORY: Past Medical History  Diagnosis Date  . Seasonal allergies   . Anxiety   . Arthritis     right shoulder  . Breast cancer 2011    left breast  . Depression   . GERD (gastroesophageal reflux disease)   . Hypertension     ALLERGIES:  is allergic to sulfa antibiotics.  MEDICATIONS:  Current Outpatient Prescriptions  Medication Sig Dispense Refill  . alendronate (FOSAMAX) 35 MG tablet Take 1 tablet (35 mg total) by  mouth every 7 (seven) days. Take with a full glass of water on an empty stomach.  12 tablet  1  . ALPRAZolam (XANAX) 0.5 MG tablet Take 0.5 mg by mouth 3 (three) times daily as needed for anxiety.      Marland Kitchen amLODipine-valsartan (EXFORGE) 5-160 MG per tablet Take 1 tablet by mouth daily.      . calcium carbonate (OS-CAL) 600 MG TABS tablet Take 600 mg by mouth 2 (two) times daily with a meal.      . cetirizine (ZYRTEC) 10 MG tablet Take 10 mg by mouth daily as needed.      . cholecalciferol (VITAMIN D) 1000 UNITS tablet Take 2,000 Units by mouth daily.      . fluticasone (FLOVENT DISKUS) 50 MCG/BLIST diskus inhaler Inhale 2 puffs into the lungs daily. Pt uses this med as needed      . sertraline (ZOLOFT) 100 MG tablet Take 100 mg by mouth 2 (two) times daily.      . tamoxifen (NOLVADEX) 20 MG tablet Take 1 tablet (20 mg total) by mouth daily.  90 tablet  1  . traZODone (DESYREL) 100 MG tablet Take 200 mg by mouth at bedtime.      Marland Kitchen HYDROcodone-acetaminophen (NORCO) 5-325 MG per tablet Take 1 tablet by mouth every 6 (six) hours as needed.      Marland Kitchen ibuprofen (ADVIL,MOTRIN) 200 MG tablet Take 200 mg by mouth every 6 (six) hours as needed.      Marland Kitchen omeprazole (PRILOSEC)  20 MG capsule Take 20 mg by mouth daily.      Marland Kitchen tiZANidine (ZANAFLEX) 4 MG tablet Take 4 mg by mouth every 6 (six) hours as needed.       Current Facility-Administered Medications  Medication Dose Route Frequency Provider Last Rate Last Dose  . 0.9 %  sodium chloride infusion  500 mL Intravenous Continuous [place] Dragon, MD        SURGICAL HISTORY:  Past Surgical History  Procedure Laterality Date  . Breast lumpectomy  2011  . Shoulder surgery  2013  . Vaginal hysterectomy  1998  . Trigger finger release  2010    left thumb  . Knee arthroscopy  2000, 1990    right and left  . Back surgery  1985, 1992  . Breast biopsy   1993    REVIEW OF SYSTEMS:  A 10 point review of systems was conducted and is otherwise negative except for  what is noted above.    Health Maintenance  Mammogram: 04/13/2013, already scheduled Colonoscopy:12/04/11 with repeat recommended in 10 years Bone Density Scan: 07/13/2013, osteopenia, on Fosamax, Calcium, Vitamin D Pap Smear: TAH/BSO in the 1990s Eye Exam: 2014, due Vitamin D Level: low/normal on 10/28/2013 Lipid Panel: 09/2013    PHYSICAL EXAMINATION: Blood pressure 115/68, pulse 59, temperature 98.4 F (36.9 C), temperature source Oral, resp. rate 20, height 5\' 4"  (1.626 m), weight 160 lb 1.6 oz (72.621 kg). Body mass index is 27.47 kg/(m^2). GENERAL: Patient is a well appearing female in no acute distress HEENT:  Sclerae anicteric.  Oropharynx clear and moist. No ulcerations or evidence of oropharyngeal candidiasis. Neck is supple.  NODES:  No cervical, supraclavicular, or axillary lymphadenopathy palpated.  BREAST EXAM:  Left lumpectomy without nodularity, no nodules or masses, right breast no nodules or masses, benign bilateral breast exam.  LUNGS:  Clear to auscultation bilaterally.  No wheezes or rhonchi. HEART:  Regular rate and rhythm. No murmur appreciated. ABDOMEN:  Soft, nontender.  Positive, normoactive bowel sounds. No organomegaly palpated. MSK:  No focal spinal tenderness to palpation. Full range of motion bilaterally in the upper extremities. EXTREMITIES:  No peripheral edema.   SKIN:  Clear with no obvious rashes or skin changes. No nail dyscrasia. NEURO:  Nonfocal. Well oriented.  Appropriate affect. ECOG PERFORMANCE STATUS: 1  LABORATORY DATA: Lab Results  Component Value Date   WBC 3.9 03/02/2014   HGB 12.2 03/02/2014   HCT 38.0 03/02/2014   MCV 88.4 03/02/2014   PLT 204 03/02/2014      Chemistry      Component Value Date/Time   NA 142 03/02/2014 1042   K 4.2 03/02/2014 1042   CO2 24 03/02/2014 1042   BUN 9.0 03/02/2014 1042   CREATININE 0.8 03/02/2014 1042      Component Value Date/Time   CALCIUM 9.3 03/02/2014 1042   ALKPHOS 71 03/02/2014 1042   AST 20 03/02/2014 1042    ALT 7 03/02/2014 1042   BILITOT 0.32 03/02/2014 1042       RADIOGRAPHIC STUDIES:  No results found.  ASSESSMENT/PLAN:: 58 year old female with  Stage I (T1 and N 0) invasive ductal carcinoma ER positive. She was started on on tamoxifen 20 mg daily in January 2012. In view of concern of Zoloft interaction with the tamoxifen,  tamoxifen was discontinued and Aromasin was started in December 2014. However patient couldn't tolerate and switched back to Tamoxifen under the guidance of Dr. [name].  She will continue this.  She has no sign  of recurrence.  I recommended healthy diet, exercise and monthly breast exams.   2.  Osteopenia.  Patient is taking fosamax weekly and tolerating it well.  She is also taking Calcium and Vitamin D.  She is due for repeat bone density in January, 2017.    3.  Vitamin D deficiency.  Her vitamin d level was 31 when last checked. She is taking Vitamin D daily and will continue this.  We will recheck her vitamin D at her next appointment.    Meghna will return in 6 months for labs and f/u with Dr. Lindi Adie.    All questions were answered. The patient knows to call the clinic with any problems, questions or concerns. We can certainly see the patient much sooner if necessary.  I spent 25 minutes counseling the patient face to face. The total time spent in the appointment was 30 minutes.  Minette Headland, [place] [phone] 03/02/2014, 2:01 PM

## 2014-03-02 NOTE — Patient Instructions (Signed)
You are doing well.  You have no sign of recurrence.  Continue on Tamoxifen.  I recommend healthy diet, monthly breast exams, and exercise.    You will see Dr. Lindi Adie in 6 months.     Breast Self-Awareness Practicing breast self-awareness may pick up problems early, prevent significant medical complications, and possibly save your life. By practicing breast self-awareness, you can become familiar with how your breasts look and feel and if your breasts are changing. This allows you to notice changes early. It can also offer you some reassurance that your breast health is good. One way to learn what is normal for your breasts and whether your breasts are changing is to do a breast self-exam. If you find a lump or something that was not present in the past, it is best to contact your caregiver right away. Other findings that should be evaluated by your caregiver include nipple discharge, especially if it is bloody; skin changes or reddening; areas where the skin seems to be pulled in (retracted); or new lumps and bumps. Breast pain is seldom associated with cancer (malignancy), but should also be evaluated by a caregiver. HOW TO PERFORM A BREAST SELF-EXAM The best time to examine your breasts is 5-7 days after your menstrual period is over. During menstruation, the breasts are lumpier, and it may be more difficult to pick up changes. If you do not menstruate, have reached menopause, or had your uterus removed (hysterectomy), you should examine your breasts at regular intervals, such as monthly. If you are breastfeeding, examine your breasts after a feeding or after using a breast pump. Breast implants do not decrease the risk for lumps or tumors, so continue to perform breast self-exams as recommended. Talk to your caregiver about how to determine the difference between the implant and breast tissue. Also, talk about the amount of pressure you should use during the exam. Over time, you will become more  familiar with the variations of your breasts and more comfortable with the exam. A breast self-exam requires you to remove all your clothes above the waist. 1. Look at your breasts and nipples. Stand in front of a mirror in a room with good lighting. With your hands on your hips, push your hands firmly downward. Look for a difference in shape, contour, and size from one breast to the other (asymmetry). Asymmetry includes puckers, dips, or bumps. Also, look for skin changes, such as reddened or scaly areas on the breasts. Look for nipple changes, such as discharge, dimpling, repositioning, or redness. 2. Carefully feel your breasts. This is best done either in the shower or tub while using soapy water or when flat on your back. Place the arm (on the side of the breast you are examining) above your head. Use the pads (not the fingertips) of your three middle fingers on your opposite hand to feel your breasts. Start in the underarm area and use  inch (2 cm) overlapping circles to feel your breast. Use 3 different levels of pressure (light, medium, and firm pressure) at each circle before moving to the next circle. The light pressure is needed to feel the tissue closest to the skin. The medium pressure will help to feel breast tissue a little deeper, while the firm pressure is needed to feel the tissue close to the ribs. Continue the overlapping circles, moving downward over the breast until you feel your ribs below your breast. Then, move one finger-width towards the center of the body. Continue to use the  inch (2 cm) overlapping circles to feel your breast as you move slowly up toward the collar bone (clavicle) near the base of the neck. Continue the up and down exam using all 3 pressures until you reach the middle of the chest. Do this with each breast, carefully feeling for lumps or changes. 3.  Keep a written record with breast changes or normal findings for each breast. By writing this information down, you  do not need to depend only on memory for size, tenderness, or location. Write down where you are in your menstrual cycle, if you are still menstruating. Breast tissue can have some lumps or thick tissue. However, see your caregiver if you find anything that concerns you.  SEEK MEDICAL CARE IF:  You see a change in shape, contour, or size of your breasts or nipples.   You see skin changes, such as reddened or scaly areas on the breasts or nipples.   You have an unusual discharge from your nipples.   You feel a new lump or unusually thick areas.  Document Released: 06/10/2005 Document Revised: 05/27/2012 Document Reviewed: 09/25/2011 [facility] Patient Information 2015 [place], [place]. This information is not intended to replace advice given to you by your health care provider. Make sure you discuss any questions you have with your health care provider.

## 2014-03-14 ENCOUNTER — Telehealth: Payer: Self-pay | Admitting: Dietician

## 2014-03-14 NOTE — Telephone Encounter (Signed)
Brief Outpatient Oncology Nutrition Note  Patient has been identified to be at risk on malnutrition screen.  Wt Readings from Last 10 Encounters:  03/02/14 160 lb 1.6 oz (72.621 kg)  10/28/13 167 lb 1.6 oz (75.796 kg)  06/08/13 161 lb 8 oz (73.256 kg)  12/04/11 180 lb (81.647 kg)  11/20/11 180 lb 3.2 oz (81.738 kg)      Dx:  Hx of Breast cancer  Called patient due to weight loss.  Patient states that she is eating well and weight loss is purposeful.  No needs at this time.  [place] RD available as needed.  Antonieta Iba, RD, LDN

## 2014-03-27 ENCOUNTER — Emergency Department (HOSPITAL_COMMUNITY): Payer: No Typology Code available for payment source

## 2014-03-27 ENCOUNTER — Emergency Department (HOSPITAL_COMMUNITY)
Admission: EM | Admit: 2014-03-27 | Discharge: 2014-03-27 | Disposition: A | Discharge status: Home / Self Care | Payer: No Typology Code available for payment source | Attending: Emergency Medicine | Admitting: Emergency Medicine

## 2014-03-27 ENCOUNTER — Encounter (HOSPITAL_COMMUNITY): Payer: Self-pay | Admitting: Emergency Medicine

## 2014-03-27 DIAGNOSIS — Z7952 Long term (current) use of systemic steroids: Secondary | ICD-10-CM | POA: Diagnosis not present

## 2014-03-27 DIAGNOSIS — M1990 Unspecified osteoarthritis, unspecified site: Secondary | ICD-10-CM | POA: Diagnosis not present

## 2014-03-27 DIAGNOSIS — S4992XA Unspecified injury of left shoulder and upper arm, initial encounter: Secondary | ICD-10-CM | POA: Insufficient documentation

## 2014-03-27 DIAGNOSIS — F419 Anxiety disorder, unspecified: Secondary | ICD-10-CM | POA: Diagnosis not present

## 2014-03-27 DIAGNOSIS — Z23 Encounter for immunization: Secondary | ICD-10-CM | POA: Diagnosis not present

## 2014-03-27 DIAGNOSIS — Z853 Personal history of malignant neoplasm of breast: Secondary | ICD-10-CM | POA: Insufficient documentation

## 2014-03-27 DIAGNOSIS — I10 Essential (primary) hypertension: Secondary | ICD-10-CM | POA: Insufficient documentation

## 2014-03-27 DIAGNOSIS — Z79899 Other long term (current) drug therapy: Secondary | ICD-10-CM | POA: Insufficient documentation

## 2014-03-27 DIAGNOSIS — Z91040 Latex allergy status: Secondary | ICD-10-CM | POA: Insufficient documentation

## 2014-03-27 DIAGNOSIS — S3992XA Unspecified injury of lower back, initial encounter: Secondary | ICD-10-CM | POA: Diagnosis not present

## 2014-03-27 DIAGNOSIS — S0101XA Laceration without foreign body of scalp, initial encounter: Secondary | ICD-10-CM

## 2014-03-27 DIAGNOSIS — S199XXA Unspecified injury of neck, initial encounter: Secondary | ICD-10-CM | POA: Insufficient documentation

## 2014-03-27 DIAGNOSIS — Y9389 Activity, other specified: Secondary | ICD-10-CM | POA: Diagnosis not present

## 2014-03-27 DIAGNOSIS — S0990XA Unspecified injury of head, initial encounter: Secondary | ICD-10-CM

## 2014-03-27 DIAGNOSIS — Y92410 Unspecified street and highway as the place of occurrence of the external cause: Secondary | ICD-10-CM | POA: Diagnosis not present

## 2014-03-27 DIAGNOSIS — S4991XA Unspecified injury of right shoulder and upper arm, initial encounter: Secondary | ICD-10-CM | POA: Insufficient documentation

## 2014-03-27 DIAGNOSIS — K219 Gastro-esophageal reflux disease without esophagitis: Secondary | ICD-10-CM | POA: Insufficient documentation

## 2014-03-27 DIAGNOSIS — F329 Major depressive disorder, single episode, unspecified: Secondary | ICD-10-CM | POA: Diagnosis not present

## 2014-03-27 DIAGNOSIS — S060X9A Concussion with loss of consciousness of unspecified duration, initial encounter: Secondary | ICD-10-CM | POA: Insufficient documentation

## 2014-03-27 DIAGNOSIS — Z87891 Personal history of nicotine dependence: Secondary | ICD-10-CM | POA: Diagnosis not present

## 2014-03-27 LAB — COMPREHENSIVE METABOLIC PANEL
ALT: 10 U/L (ref 0–35)
ANION GAP: 10 (ref 5–15)
AST: 23 U/L (ref 0–37)
Albumin: 3.9 g/dL (ref 3.5–5.2)
Alkaline Phosphatase: 82 U/L (ref 39–117)
BILIRUBIN TOTAL: 0.3 mg/dL (ref 0.3–1.2)
BUN: 10 mg/dL (ref 6–23)
CHLORIDE: 104 meq/L (ref 96–112)
CO2: 27 meq/L (ref 19–32)
Calcium: 9 mg/dL (ref 8.4–10.5)
Creatinine, Ser: 0.73 mg/dL (ref 0.50–1.10)
GFR calc Af Amer: >90 mL/min (ref >90)
Glucose, Bld: 98 mg/dL (ref 70–99)
Potassium: 4 mEq/L (ref 3.7–5.3)
Sodium: 141 mEq/L (ref 137–147)
Total Protein: 7.4 g/dL (ref 6.0–8.3)

## 2014-03-27 LAB — URINALYSIS, ROUTINE W REFLEX MICROSCOPIC
Bilirubin Urine: NEGATIVE
GLUCOSE, UA: NEGATIVE mg/dL
Hgb urine dipstick: NEGATIVE
Ketones, ur: NEGATIVE mg/dL
LEUKOCYTES UA: NEGATIVE
Nitrite: NEGATIVE
Protein, ur: NEGATIVE mg/dL
Specific Gravity, Urine: 1.01 (ref 1.005–1.030)
Urobilinogen, UA: 0.2 mg/dL (ref 0.0–1.0)
pH: 6.5 (ref 5.0–8.0)

## 2014-03-27 LAB — CBC WITH DIFFERENTIAL/PLATELET
Basophils Absolute: 0 10*3/uL (ref 0.0–0.1)
Basophils Relative: 1 % (ref 0–1)
Eosinophils Absolute: 0 10*3/uL (ref 0.0–0.7)
Eosinophils Relative: 1 % (ref 0–5)
HEMATOCRIT: 35.1 % — AB (ref 36.0–46.0)
HEMOGLOBIN: 12.2 g/dL (ref 12.0–15.0)
LYMPHS PCT: 36 % (ref 12–46)
Lymphs Abs: 1.5 10*3/uL (ref 0.7–4.0)
MCH: 30 pg (ref 26.0–34.0)
MCHC: 34.8 g/dL (ref 30.0–36.0)
MCV: 86.5 fL (ref 78.0–100.0)
MONO ABS: 0.3 10*3/uL (ref 0.1–1.0)
MONOS PCT: 6 % (ref 3–12)
NEUTROS ABS: 2.4 10*3/uL (ref 1.7–7.7)
Neutrophils Relative %: 56 % (ref 43–77)
Platelets: 217 10*3/uL (ref 150–400)
RBC: 4.06 MIL/uL (ref 3.87–5.11)
RDW: 13.5 % (ref 11.5–15.5)
WBC: 4.2 10*3/uL (ref 4.0–10.5)

## 2014-03-27 MED ORDER — TETANUS-DIPHTH-ACELL PERTUSSIS 5-2.5-18.5 LF-MCG/0.5 IM SUSP
0.5000 mL | Freq: Once | INTRAMUSCULAR | Status: AC
  Administered 2014-03-27: 0.5 mL via INTRAMUSCULAR
  Filled 2014-03-27: qty 0.5

## 2014-03-27 MED ORDER — HYDROMORPHONE HCL 1 MG/ML IJ SOLN
0.5000 mg | Freq: Once | INTRAMUSCULAR | Status: AC
  Administered 2014-03-27: 0.5 mg via INTRAVENOUS
  Filled 2014-03-27: qty 1

## 2014-03-27 MED ORDER — IOHEXOL 300 MG/ML  SOLN
100.0000 mL | Freq: Once | INTRAMUSCULAR | Status: AC | PRN
  Administered 2014-03-27: 100 mL via INTRAVENOUS

## 2014-03-27 MED ORDER — OXYCODONE-ACETAMINOPHEN 5-325 MG PO TABS: 1.0000 | ORAL_TABLET | ORAL | Status: AC | PRN

## 2014-03-27 MED ORDER — ONDANSETRON HCL 4 MG/2ML IJ SOLN
4.0000 mg | Freq: Once | INTRAMUSCULAR | Status: AC
  Administered 2014-03-27: 4 mg via INTRAVENOUS
  Filled 2014-03-27: qty 2

## 2014-03-27 NOTE — Discharge Instructions (Signed)
Concussion A concussion, or closed-head injury, is a brain injury caused by a direct blow to the head or by a quick and sudden movement (jolt) of the head or neck. Concussions are usually not life-threatening. Even so, the effects of a concussion can be serious. If you have had a concussion before, you are more likely to experience concussion-like symptoms after a direct blow to the head.  CAUSES  Direct blow to the head, such as from running into another player during a soccer game, being hit in a fight, or hitting your head on a hard surface.  A jolt of the head or neck that causes the brain to move back and forth inside the skull, such as in a car crash. SIGNS AND SYMPTOMS The signs of a concussion can be hard to notice. Early on, they may be missed by you, family members, and health care providers. You may look fine but act or feel differently. Symptoms are usually temporary, but they may last for days, weeks, or even longer. Some symptoms may appear right away while others may not show up for hours or days. Every head injury is different. Symptoms include:  Mild to moderate headaches that will not go away.  A feeling of pressure inside your head.  Having more trouble than usual:  Learning or remembering things you have heard.  Answering questions.  Paying attention or concentrating.  Organizing daily tasks.  Making decisions and solving problems.  Slowness in thinking, acting or reacting, speaking, or reading.  Getting lost or being easily confused.  Feeling tired all the time or lacking energy (fatigued).  Feeling drowsy.  Sleep disturbances.  Sleeping more than usual.  Sleeping less than usual.  Trouble falling asleep.  Trouble sleeping (insomnia).  Loss of balance or feeling lightheaded or dizzy.  Nausea or vomiting.  Numbness or tingling.  Increased sensitivity to:  Sounds.  Lights.  Distractions.  Vision problems or eyes that tire  easily.  Diminished sense of taste or smell.  Ringing in the ears.  Mood changes such as feeling sad or anxious.  Becoming easily irritated or angry for little or no reason.  Lack of motivation.  Seeing or hearing things other people do not see or hear (hallucinations). DIAGNOSIS Your health care provider can usually diagnose a concussion based on a description of your injury and symptoms. He or she will ask whether you passed out (lost consciousness) and whether you are having trouble remembering events that happened right before and during your injury. Your evaluation might include:  A brain scan to look for signs of injury to the brain. Even if the test shows no injury, you may still have a concussion.  Blood tests to be sure other problems are not present. TREATMENT  Concussions are usually treated in an emergency department, in urgent care, or at a clinic. You may need to stay in the hospital overnight for further treatment.  Tell your health care provider if you are taking any medicines, including prescription medicines, over-the-counter medicines, and natural remedies. Some medicines, such as blood thinners (anticoagulants) and aspirin, may increase the chance of complications. Also tell your health care provider whether you have had alcohol or are taking illegal drugs. This information may affect treatment.  Your health care provider will send you home with important instructions to follow.  How fast you will recover from a concussion depends on many factors. These factors include how severe your concussion is, what part of your brain was injured, your  age, and how healthy you were before the concussion.  Most people with mild injuries recover fully. Recovery can take time. In general, recovery is slower in older persons. Also, persons who have had a concussion in the past or have other medical problems may find that it takes longer to recover from their current injury. HOME  CARE INSTRUCTIONS General Instructions  Carefully follow the directions your health care provider gave you.  Only take over-the-counter or prescription medicines for pain, discomfort, or fever as directed by your health care provider.  Take only those medicines that your health care provider has approved.  Do not drink alcohol until your health care provider says you are well enough to do so. Alcohol and certain other drugs may slow your recovery and can put you at risk of further injury.  If it is harder than usual to remember things, write them down.  If you are easily distracted, try to do one thing at a time. For example, do not try to watch TV while fixing dinner.  Talk with family members or close friends when making important decisions.  Keep all follow-up appointments. Repeated evaluation of your symptoms is recommended for your recovery.  Watch your symptoms and tell others to do the same. Complications sometimes occur after a concussion. Older adults with a brain injury may have a higher risk of serious complications, such as a blood clot on the brain.  Tell your teachers, school nurse, school counselor, coach, athletic trainer, or work Freight forwarder about your injury, symptoms, and restrictions. Tell them about what you can or cannot do. They should watch for:  Increased problems with attention or concentration.  Increased difficulty remembering or learning new information.  Increased time needed to complete tasks or assignments.  Increased irritability or decreased ability to cope with stress.  Increased symptoms.  Rest. Rest helps the brain to heal. Make sure you:  Get plenty of sleep at night. Avoid staying up late at night.  Keep the same bedtime hours on weekends and weekdays.  Rest during the day. Take daytime naps or rest breaks when you feel tired.  Limit activities that require a lot of thought or concentration. These include:  Doing homework or job-related  work.  Watching TV.  Working on the computer.  Avoid any situation where there is potential for another head injury (football, hockey, soccer, basketball, martial arts, downhill snow sports and horseback riding). Your condition will get worse every time you experience a concussion. You should avoid these activities until you are evaluated by the appropriate follow-up health care providers. Returning To Your Regular Activities You will need to return to your normal activities slowly, not all at once. You must give your body and brain enough time for recovery.  Do not return to sports or other athletic activities until your health care provider tells you it is safe to do so.  Ask your health care provider when you can drive, ride a bicycle, or operate heavy machinery. Your ability to react may be slower after a brain injury. Never do these activities if you are dizzy.  Ask your health care provider about when you can return to work or school. Preventing Another Concussion It is very important to avoid another brain injury, especially before you have recovered. In rare cases, another injury can lead to permanent brain damage, brain swelling, or death. The risk of this is greatest during the first 7-10 days after a head injury. Avoid injuries by:  Wearing a seat  belt when riding in a car.  Drinking alcohol only in moderation.  Wearing a helmet when biking, skiing, skateboarding, skating, or doing similar activities.  Avoiding activities that could lead to a second concussion, such as contact or recreational sports, until your health care provider says it is okay.  Taking safety measures in your home.  Remove clutter and tripping hazards from floors and stairways.  Use grab bars in bathrooms and handrails by stairs.  Place non-slip mats on floors and in bathtubs.  Improve lighting in dim areas. SEEK MEDICAL CARE IF:  You have increased problems paying attention or  concentrating.  You have increased difficulty remembering or learning new information.  You need more time to complete tasks or assignments than before.  You have increased irritability or decreased ability to cope with stress.  You have more symptoms than before. Seek medical care if you have any of the following symptoms for more than 2 weeks after your injury:  Lasting (chronic) headaches.  Dizziness or balance problems.  Nausea.  Vision problems.  Increased sensitivity to noise or light.  Depression or mood swings.  Anxiety or irritability.  Memory problems.  Difficulty concentrating or paying attention.  Sleep problems.  Feeling tired all the time. SEEK IMMEDIATE MEDICAL CARE IF:  You have severe or worsening headaches. These may be a sign of a blood clot in the brain.  You have weakness (even if only in one hand, leg, or part of the face).  You have numbness.  You have decreased coordination.  You vomit repeatedly.  You have increased sleepiness.  One pupil is larger than the other.  You have convulsions.  You have slurred speech.  You have increased confusion. This may be a sign of a blood clot in the brain.  You have increased restlessness, agitation, or irritability.  You are unable to recognize people or places.  You have neck pain.  It is difficult to wake you up.  You have unusual behavior changes.  You lose consciousness. MAKE SURE YOU:  Understand these instructions.  Will watch your condition.  Will get help right away if you are not doing well or get worse. Document Released: 08/31/2003 Document Revised: 06/15/2013 Document Reviewed: 12/31/2012 [facility] Patient Information 2015 [place], [place]. This information is not intended to replace advice given to you by your health care provider. Make sure you discuss any questions you have with your health care provider.  Blunt Trauma You have been evaluated for injuries. You have  been examined and your caregiver has not found injuries serious enough to require hospitalization. It is common to have multiple bruises and sore muscles following an accident. These tend to feel worse for the first 24 hours. You will feel more stiffness and soreness over the next several hours and worse when you wake up the first morning after your accident. After this point, you should begin to improve with each passing day. The amount of improvement depends on the amount of damage done in the accident. Following your accident, if some part of your body does not work as it should, or if the pain in any area continues to increase, you should return to the Emergency Department for re-evaluation.  HOME CARE INSTRUCTIONS  Routine care for sore areas should include:  Ice to sore areas every 2 hours for 20 minutes while awake for the next 2 days.  Drink extra fluids (not alcohol).  Take a hot or warm shower or bath once or twice a day  to increase blood flow to sore muscles. This will help you "limber up".  Activity as tolerated. Lifting may aggravate neck or back pain.  Only take over-the-counter or prescription medicines for pain, discomfort, or fever as directed by your caregiver. Do not use aspirin. This may increase bruising or increase bleeding if there are small areas where this is happening. SEEK IMMEDIATE MEDICAL CARE IF:  Numbness, tingling, weakness, or problem with the use of your arms or legs.  A severe headache is not relieved with medications.  There is a change in bowel or bladder control.  Increasing pain in any areas of the body.  Short of breath or dizzy.  Nauseated, vomiting, or sweating.  Increasing belly (abdominal) discomfort.  Blood in urine, stool, or vomiting blood.  Pain in either shoulder in an area where a shoulder strap would be.  Feelings of lightheadedness or if you have a fainting episode. Sometimes it is not possible to identify all injuries  immediately after the trauma. It is important that you continue to monitor your condition after the emergency department visit. If you feel you are not improving, or improving more slowly than should be expected, call your physician. If you feel your symptoms (problems) are worsening, return to the Emergency Department immediately. Document Released: 03/06/2001 Document Revised: 09/02/2011 Document Reviewed: 01/27/2008 [facility] Patient Information 2015 [place], [place]. This information is not intended to replace advice given to you by your health care provider. Make sure you discuss any questions you have with your health care provider.
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
## 2014-06-29 DIAGNOSIS — Z1389 Encounter for screening for other disorder: Secondary | ICD-10-CM | POA: Diagnosis not present

## 2014-06-29 DIAGNOSIS — K210 Gastro-esophageal reflux disease with esophagitis: Secondary | ICD-10-CM | POA: Diagnosis not present

## 2014-06-29 DIAGNOSIS — E669 Obesity, unspecified: Secondary | ICD-10-CM | POA: Diagnosis not present

## 2014-06-29 DIAGNOSIS — F325 Major depressive disorder, single episode, in full remission: Secondary | ICD-10-CM | POA: Diagnosis not present

## 2014-06-29 DIAGNOSIS — J301 Allergic rhinitis due to pollen: Secondary | ICD-10-CM | POA: Diagnosis not present

## 2014-06-29 DIAGNOSIS — I10 Essential (primary) hypertension: Secondary | ICD-10-CM | POA: Diagnosis not present

## 2014-09-01 ENCOUNTER — Other Ambulatory Visit: Payer: Self-pay

## 2014-09-01 ENCOUNTER — Ambulatory Visit: Payer: Self-pay | Admitting: Hematology and Oncology

## 2014-09-01 ENCOUNTER — Telehealth: Payer: Self-pay | Admitting: Hematology and Oncology

## 2014-09-01 NOTE — Telephone Encounter (Signed)
Called and lm with new appt as she did not keep her appt today

## 2014-09-01 NOTE — Assessment & Plan Note (Signed)
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 She was started on on tamoxifen 20 mg daily in January 2012. In view of concern of Zoloft interaction with the tamoxifen, tamoxifen was discontinued and Aromasin was started in December 2014. However patient couldn't tolerate and switched back to Tamoxifen under the guidance of Dr. [name].  Tamoxifen toxicities:  Breast cancer surveillance: 1. Breast exam 09/01/2014 is normal 2. Bone density January 2015 T score -1.8: Osteopenia: On Fosamax and calcium and vitamin D 3. I do not have any copies of mammograms.  Return to clinic once a year for follow-up

## 2014-09-16 ENCOUNTER — Other Ambulatory Visit: Payer: Self-pay | Admitting: *Deleted

## 2014-09-16 DIAGNOSIS — C50212 Malignant neoplasm of upper-inner quadrant of left female breast: Secondary | ICD-10-CM

## 2014-09-19 ENCOUNTER — Ambulatory Visit: Payer: Self-pay | Admitting: Hematology and Oncology

## 2014-09-19 ENCOUNTER — Other Ambulatory Visit: Payer: Self-pay

## 2014-11-02 DIAGNOSIS — R921 Mammographic calcification found on diagnostic imaging of breast: Secondary | ICD-10-CM | POA: Diagnosis not present

## 2014-11-02 DIAGNOSIS — F325 Major depressive disorder, single episode, in full remission: Secondary | ICD-10-CM | POA: Diagnosis not present

## 2014-11-02 DIAGNOSIS — Z853 Personal history of malignant neoplasm of breast: Secondary | ICD-10-CM | POA: Diagnosis not present

## 2014-11-02 DIAGNOSIS — C50919 Malignant neoplasm of unspecified site of unspecified female breast: Secondary | ICD-10-CM | POA: Diagnosis not present

## 2014-11-02 DIAGNOSIS — E669 Obesity, unspecified: Secondary | ICD-10-CM | POA: Diagnosis not present

## 2014-11-02 DIAGNOSIS — Z9889 Other specified postprocedural states: Secondary | ICD-10-CM | POA: Diagnosis not present

## 2014-11-02 DIAGNOSIS — Z0001 Encounter for general adult medical examination with abnormal findings: Secondary | ICD-10-CM | POA: Diagnosis not present

## 2014-11-02 DIAGNOSIS — J301 Allergic rhinitis due to pollen: Secondary | ICD-10-CM | POA: Diagnosis not present

## 2014-11-02 DIAGNOSIS — I10 Essential (primary) hypertension: Secondary | ICD-10-CM | POA: Diagnosis not present
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
## 2015-04-19 DIAGNOSIS — I10 Essential (primary) hypertension: Secondary | ICD-10-CM | POA: Diagnosis not present

## 2015-04-19 DIAGNOSIS — K219 Gastro-esophageal reflux disease without esophagitis: Secondary | ICD-10-CM | POA: Diagnosis not present

## 2015-05-01 DIAGNOSIS — E669 Obesity, unspecified: Secondary | ICD-10-CM | POA: Diagnosis not present

## 2015-05-01 DIAGNOSIS — K219 Gastro-esophageal reflux disease without esophagitis: Secondary | ICD-10-CM | POA: Diagnosis not present

## 2015-05-01 DIAGNOSIS — J301 Allergic rhinitis due to pollen: Secondary | ICD-10-CM | POA: Diagnosis not present

## 2015-05-01 DIAGNOSIS — I10 Essential (primary) hypertension: Secondary | ICD-10-CM | POA: Diagnosis not present

## 2015-05-01 DIAGNOSIS — F331 Major depressive disorder, recurrent, moderate: Secondary | ICD-10-CM | POA: Diagnosis not present

## 2015-05-17 DIAGNOSIS — R921 Mammographic calcification found on diagnostic imaging of breast: Secondary | ICD-10-CM | POA: Diagnosis not present

## 2015-05-17 DIAGNOSIS — Z9889 Other specified postprocedural states: Secondary | ICD-10-CM | POA: Diagnosis not present

## 2015-05-17 DIAGNOSIS — R928 Other abnormal and inconclusive findings on diagnostic imaging of breast: Secondary | ICD-10-CM | POA: Diagnosis not present

## 2015-08-05 DIAGNOSIS — J0101 Acute recurrent maxillary sinusitis: Secondary | ICD-10-CM | POA: Diagnosis not present

## 2015-08-14 DIAGNOSIS — J301 Allergic rhinitis due to pollen: Secondary | ICD-10-CM | POA: Diagnosis not present

## 2015-08-14 DIAGNOSIS — J019 Acute sinusitis, unspecified: Secondary | ICD-10-CM | POA: Diagnosis not present

## 2015-08-14 DIAGNOSIS — Z1389 Encounter for screening for other disorder: Secondary | ICD-10-CM | POA: Diagnosis not present

## 2015-08-14 DIAGNOSIS — I10 Essential (primary) hypertension: Secondary | ICD-10-CM | POA: Diagnosis not present

## 2015-08-14 DIAGNOSIS — K219 Gastro-esophageal reflux disease without esophagitis: Secondary | ICD-10-CM | POA: Diagnosis not present

## 2015-11-22 DIAGNOSIS — I10 Essential (primary) hypertension: Secondary | ICD-10-CM | POA: Diagnosis not present

## 2015-11-22 DIAGNOSIS — K219 Gastro-esophageal reflux disease without esophagitis: Secondary | ICD-10-CM | POA: Diagnosis not present

## 2015-11-24 DIAGNOSIS — J301 Allergic rhinitis due to pollen: Secondary | ICD-10-CM | POA: Diagnosis not present

## 2015-11-24 DIAGNOSIS — Z9189 Other specified personal risk factors, not elsewhere classified: Secondary | ICD-10-CM | POA: Diagnosis not present

## 2015-11-24 DIAGNOSIS — K219 Gastro-esophageal reflux disease without esophagitis: Secondary | ICD-10-CM | POA: Diagnosis not present

## 2015-11-24 DIAGNOSIS — Z0001 Encounter for general adult medical examination with abnormal findings: Secondary | ICD-10-CM | POA: Diagnosis not present

## 2015-11-24 DIAGNOSIS — Z1212 Encounter for screening for malignant neoplasm of rectum: Secondary | ICD-10-CM | POA: Diagnosis not present

## 2015-11-24 DIAGNOSIS — Z23 Encounter for immunization: Secondary | ICD-10-CM | POA: Diagnosis not present

## 2015-11-24 DIAGNOSIS — I10 Essential (primary) hypertension: Secondary | ICD-10-CM | POA: Diagnosis not present

## 2016-05-22 DIAGNOSIS — Z853 Personal history of malignant neoplasm of breast: Secondary | ICD-10-CM | POA: Diagnosis not present

## 2016-05-22 DIAGNOSIS — R928 Other abnormal and inconclusive findings on diagnostic imaging of breast: Secondary | ICD-10-CM | POA: Diagnosis not present

## 2016-05-27 DIAGNOSIS — C50919 Malignant neoplasm of unspecified site of unspecified female breast: Secondary | ICD-10-CM | POA: Diagnosis not present

## 2016-05-27 DIAGNOSIS — K210 Gastro-esophageal reflux disease with esophagitis: Secondary | ICD-10-CM | POA: Diagnosis not present

## 2016-05-27 DIAGNOSIS — Z9189 Other specified personal risk factors, not elsewhere classified: Secondary | ICD-10-CM | POA: Diagnosis not present

## 2016-05-27 DIAGNOSIS — I10 Essential (primary) hypertension: Secondary | ICD-10-CM | POA: Diagnosis not present

## 2016-06-03 DIAGNOSIS — J301 Allergic rhinitis due to pollen: Secondary | ICD-10-CM | POA: Diagnosis not present

## 2016-06-03 DIAGNOSIS — K219 Gastro-esophageal reflux disease without esophagitis: Secondary | ICD-10-CM | POA: Diagnosis not present

## 2016-06-03 DIAGNOSIS — I10 Essential (primary) hypertension: Secondary | ICD-10-CM | POA: Diagnosis not present

## 2016-06-03 DIAGNOSIS — Z1212 Encounter for screening for malignant neoplasm of rectum: Secondary | ICD-10-CM | POA: Diagnosis not present

## 2016-10-28 DIAGNOSIS — H5213 Myopia, bilateral: Secondary | ICD-10-CM | POA: Diagnosis not present

## 2016-12-16 DIAGNOSIS — Z0001 Encounter for general adult medical examination with abnormal findings: Secondary | ICD-10-CM | POA: Diagnosis not present

## 2016-12-16 DIAGNOSIS — Z9189 Other specified personal risk factors, not elsewhere classified: Secondary | ICD-10-CM | POA: Diagnosis not present

## 2016-12-16 DIAGNOSIS — Z1212 Encounter for screening for malignant neoplasm of rectum: Secondary | ICD-10-CM | POA: Diagnosis not present

## 2016-12-16 DIAGNOSIS — I10 Essential (primary) hypertension: Secondary | ICD-10-CM | POA: Diagnosis not present

## 2016-12-16 DIAGNOSIS — K219 Gastro-esophageal reflux disease without esophagitis: Secondary | ICD-10-CM | POA: Diagnosis not present

## 2016-12-16 DIAGNOSIS — J301 Allergic rhinitis due to pollen: Secondary | ICD-10-CM | POA: Diagnosis not present

## 2017-06-20 DIAGNOSIS — K219 Gastro-esophageal reflux disease without esophagitis: Secondary | ICD-10-CM | POA: Diagnosis not present

## 2017-06-20 DIAGNOSIS — J301 Allergic rhinitis due to pollen: Secondary | ICD-10-CM | POA: Diagnosis not present

## 2017-06-20 DIAGNOSIS — M170 Bilateral primary osteoarthritis of knee: Secondary | ICD-10-CM | POA: Diagnosis not present

## 2017-06-20 DIAGNOSIS — I10 Essential (primary) hypertension: Secondary | ICD-10-CM | POA: Diagnosis not present

## 2017-06-25 DIAGNOSIS — Z853 Personal history of malignant neoplasm of breast: Secondary | ICD-10-CM | POA: Diagnosis not present

## 2017-06-25 DIAGNOSIS — Z9889 Other specified postprocedural states: Secondary | ICD-10-CM | POA: Diagnosis not present

## 2017-06-25 DIAGNOSIS — R928 Other abnormal and inconclusive findings on diagnostic imaging of breast: Secondary | ICD-10-CM | POA: Diagnosis not present

## 2017-11-07 DIAGNOSIS — R05 Cough: Secondary | ICD-10-CM | POA: Diagnosis not present

## 2017-11-07 DIAGNOSIS — R509 Fever, unspecified: Secondary | ICD-10-CM | POA: Diagnosis not present

## 2017-11-07 DIAGNOSIS — R0602 Shortness of breath: Secondary | ICD-10-CM | POA: Diagnosis not present

## 2017-12-19 DIAGNOSIS — I10 Essential (primary) hypertension: Secondary | ICD-10-CM | POA: Diagnosis not present

## 2017-12-19 DIAGNOSIS — K210 Gastro-esophageal reflux disease with esophagitis: Secondary | ICD-10-CM | POA: Diagnosis not present

## 2017-12-19 DIAGNOSIS — Z9189 Other specified personal risk factors, not elsewhere classified: Secondary | ICD-10-CM | POA: Diagnosis not present

## 2017-12-19 DIAGNOSIS — R0602 Shortness of breath: Secondary | ICD-10-CM | POA: Diagnosis not present

## 2017-12-26 DIAGNOSIS — Z1212 Encounter for screening for malignant neoplasm of rectum: Secondary | ICD-10-CM | POA: Diagnosis not present

## 2017-12-26 DIAGNOSIS — I10 Essential (primary) hypertension: Secondary | ICD-10-CM | POA: Diagnosis not present

## 2017-12-26 DIAGNOSIS — K219 Gastro-esophageal reflux disease without esophagitis: Secondary | ICD-10-CM | POA: Diagnosis not present

## 2017-12-26 DIAGNOSIS — Z0001 Encounter for general adult medical examination with abnormal findings: Secondary | ICD-10-CM | POA: Diagnosis not present

## 2017-12-26 DIAGNOSIS — J301 Allergic rhinitis due to pollen: Secondary | ICD-10-CM | POA: Diagnosis not present

## 2017-12-26 DIAGNOSIS — Z9189 Other specified personal risk factors, not elsewhere classified: Secondary | ICD-10-CM | POA: Diagnosis not present

## 2018-01-29 DIAGNOSIS — H524 Presbyopia: Secondary | ICD-10-CM | POA: Diagnosis not present

## 2018-01-29 DIAGNOSIS — H04122 Dry eye syndrome of left lacrimal gland: Secondary | ICD-10-CM | POA: Diagnosis not present

## 2018-03-16 DIAGNOSIS — H40013 Open angle with borderline findings, low risk, bilateral: Secondary | ICD-10-CM | POA: Diagnosis not present

## 2018-03-16 DIAGNOSIS — H2512 Age-related nuclear cataract, left eye: Secondary | ICD-10-CM | POA: Diagnosis not present

## 2018-03-16 DIAGNOSIS — H353132 Nonexudative age-related macular degeneration, bilateral, intermediate dry stage: Secondary | ICD-10-CM | POA: Diagnosis not present

## 2018-03-16 DIAGNOSIS — H25013 Cortical age-related cataract, bilateral: Secondary | ICD-10-CM | POA: Diagnosis not present

## 2018-03-16 DIAGNOSIS — H2513 Age-related nuclear cataract, bilateral: Secondary | ICD-10-CM | POA: Diagnosis not present

## 2018-03-24 DIAGNOSIS — H25812 Combined forms of age-related cataract, left eye: Secondary | ICD-10-CM | POA: Diagnosis not present

## 2018-03-24 DIAGNOSIS — H2512 Age-related nuclear cataract, left eye: Secondary | ICD-10-CM | POA: Diagnosis not present

## 2018-04-13 DIAGNOSIS — H2511 Age-related nuclear cataract, right eye: Secondary | ICD-10-CM | POA: Diagnosis not present

## 2018-04-13 DIAGNOSIS — H25011 Cortical age-related cataract, right eye: Secondary | ICD-10-CM | POA: Diagnosis not present

## 2018-04-21 DIAGNOSIS — H25811 Combined forms of age-related cataract, right eye: Secondary | ICD-10-CM | POA: Diagnosis not present

## 2018-04-21 DIAGNOSIS — H2511 Age-related nuclear cataract, right eye: Secondary | ICD-10-CM | POA: Diagnosis not present

## 2018-06-22 DIAGNOSIS — Z9189 Other specified personal risk factors, not elsewhere classified: Secondary | ICD-10-CM | POA: Diagnosis not present

## 2018-06-22 DIAGNOSIS — J301 Allergic rhinitis due to pollen: Secondary | ICD-10-CM | POA: Diagnosis not present

## 2018-06-22 DIAGNOSIS — Z79891 Long term (current) use of opiate analgesic: Secondary | ICD-10-CM | POA: Diagnosis not present

## 2018-06-22 DIAGNOSIS — M170 Bilateral primary osteoarthritis of knee: Secondary | ICD-10-CM | POA: Diagnosis not present

## 2018-06-22 DIAGNOSIS — K219 Gastro-esophageal reflux disease without esophagitis: Secondary | ICD-10-CM | POA: Diagnosis not present

## 2018-06-22 DIAGNOSIS — G252 Other specified forms of tremor: Secondary | ICD-10-CM | POA: Diagnosis not present

## 2018-06-22 DIAGNOSIS — I10 Essential (primary) hypertension: Secondary | ICD-10-CM | POA: Diagnosis not present

## 2018-11-18 DIAGNOSIS — M170 Bilateral primary osteoarthritis of knee: Secondary | ICD-10-CM | POA: Diagnosis not present

## 2018-11-18 DIAGNOSIS — J301 Allergic rhinitis due to pollen: Secondary | ICD-10-CM | POA: Diagnosis not present

## 2018-11-18 DIAGNOSIS — I10 Essential (primary) hypertension: Secondary | ICD-10-CM | POA: Diagnosis not present

## 2018-11-18 DIAGNOSIS — K219 Gastro-esophageal reflux disease without esophagitis: Secondary | ICD-10-CM | POA: Diagnosis not present

## 2018-12-24 DIAGNOSIS — R5383 Other fatigue: Secondary | ICD-10-CM | POA: Diagnosis not present

## 2018-12-24 DIAGNOSIS — K210 Gastro-esophageal reflux disease with esophagitis: Secondary | ICD-10-CM | POA: Diagnosis not present

## 2018-12-24 DIAGNOSIS — I10 Essential (primary) hypertension: Secondary | ICD-10-CM | POA: Diagnosis not present

## 2018-12-29 DIAGNOSIS — I10 Essential (primary) hypertension: Secondary | ICD-10-CM | POA: Diagnosis not present

## 2018-12-29 DIAGNOSIS — G252 Other specified forms of tremor: Secondary | ICD-10-CM | POA: Diagnosis not present

## 2018-12-29 DIAGNOSIS — Z0001 Encounter for general adult medical examination with abnormal findings: Secondary | ICD-10-CM | POA: Diagnosis not present

## 2018-12-29 DIAGNOSIS — Z1212 Encounter for screening for malignant neoplasm of rectum: Secondary | ICD-10-CM | POA: Diagnosis not present

## 2018-12-29 DIAGNOSIS — K219 Gastro-esophageal reflux disease without esophagitis: Secondary | ICD-10-CM | POA: Diagnosis not present

## 2019-01-06 DIAGNOSIS — R928 Other abnormal and inconclusive findings on diagnostic imaging of breast: Secondary | ICD-10-CM | POA: Diagnosis not present

## 2019-01-06 DIAGNOSIS — Z853 Personal history of malignant neoplasm of breast: Secondary | ICD-10-CM | POA: Diagnosis not present

## 2019-01-11 DIAGNOSIS — M85851 Other specified disorders of bone density and structure, right thigh: Secondary | ICD-10-CM | POA: Diagnosis not present

## 2019-01-11 DIAGNOSIS — M810 Age-related osteoporosis without current pathological fracture: Secondary | ICD-10-CM | POA: Diagnosis not present

## 2019-01-11 DIAGNOSIS — M8589 Other specified disorders of bone density and structure, multiple sites: Secondary | ICD-10-CM | POA: Diagnosis not present

## 2019-01-22 DIAGNOSIS — D2372 Other benign neoplasm of skin of left lower limb, including hip: Secondary | ICD-10-CM | POA: Diagnosis not present

## 2019-01-22 DIAGNOSIS — D485 Neoplasm of uncertain behavior of skin: Secondary | ICD-10-CM | POA: Diagnosis not present

## 2019-04-01 DIAGNOSIS — R5383 Other fatigue: Secondary | ICD-10-CM | POA: Diagnosis not present

## 2019-04-01 DIAGNOSIS — I10 Essential (primary) hypertension: Secondary | ICD-10-CM | POA: Diagnosis not present

## 2019-04-01 DIAGNOSIS — K219 Gastro-esophageal reflux disease without esophagitis: Secondary | ICD-10-CM | POA: Diagnosis not present

## 2019-04-06 DIAGNOSIS — M170 Bilateral primary osteoarthritis of knee: Secondary | ICD-10-CM | POA: Diagnosis not present

## 2019-04-06 DIAGNOSIS — M545 Low back pain: Secondary | ICD-10-CM | POA: Diagnosis not present

## 2019-04-06 DIAGNOSIS — Z1389 Encounter for screening for other disorder: Secondary | ICD-10-CM | POA: Diagnosis not present

## 2019-04-06 DIAGNOSIS — Z9189 Other specified personal risk factors, not elsewhere classified: Secondary | ICD-10-CM | POA: Diagnosis not present

## 2019-04-06 DIAGNOSIS — Z23 Encounter for immunization: Secondary | ICD-10-CM | POA: Diagnosis not present

## 2019-04-06 DIAGNOSIS — I10 Essential (primary) hypertension: Secondary | ICD-10-CM | POA: Diagnosis not present

## 2019-04-06 DIAGNOSIS — K219 Gastro-esophageal reflux disease without esophagitis: Secondary | ICD-10-CM | POA: Diagnosis not present

## 2019-04-09 DIAGNOSIS — K8080 Other cholelithiasis without obstruction: Secondary | ICD-10-CM | POA: Diagnosis not present

## 2019-04-09 DIAGNOSIS — R7989 Other specified abnormal findings of blood chemistry: Secondary | ICD-10-CM | POA: Diagnosis not present

## 2019-04-09 DIAGNOSIS — K8020 Calculus of gallbladder without cholecystitis without obstruction: Secondary | ICD-10-CM | POA: Diagnosis not present

## 2019-04-23 DIAGNOSIS — I10 Essential (primary) hypertension: Secondary | ICD-10-CM | POA: Diagnosis not present

## 2019-05-24 DIAGNOSIS — I10 Essential (primary) hypertension: Secondary | ICD-10-CM | POA: Diagnosis not present

## 2019-07-12 DIAGNOSIS — Z13220 Encounter for screening for lipoid disorders: Secondary | ICD-10-CM | POA: Diagnosis not present

## 2019-07-12 DIAGNOSIS — R5383 Other fatigue: Secondary | ICD-10-CM | POA: Diagnosis not present

## 2019-07-12 DIAGNOSIS — K219 Gastro-esophageal reflux disease without esophagitis: Secondary | ICD-10-CM | POA: Diagnosis not present

## 2019-07-12 DIAGNOSIS — I10 Essential (primary) hypertension: Secondary | ICD-10-CM | POA: Diagnosis not present

## 2019-07-15 DIAGNOSIS — M170 Bilateral primary osteoarthritis of knee: Secondary | ICD-10-CM | POA: Diagnosis not present

## 2019-07-15 DIAGNOSIS — I10 Essential (primary) hypertension: Secondary | ICD-10-CM | POA: Diagnosis not present

## 2019-07-15 DIAGNOSIS — J301 Allergic rhinitis due to pollen: Secondary | ICD-10-CM | POA: Diagnosis not present

## 2019-07-15 DIAGNOSIS — K219 Gastro-esophageal reflux disease without esophagitis: Secondary | ICD-10-CM | POA: Diagnosis not present

## 2019-07-15 DIAGNOSIS — R4582 Worries: Secondary | ICD-10-CM | POA: Diagnosis not present

## 2019-08-20 DIAGNOSIS — I10 Essential (primary) hypertension: Secondary | ICD-10-CM | POA: Diagnosis not present

## 2019-08-20 DIAGNOSIS — E7849 Other hyperlipidemia: Secondary | ICD-10-CM | POA: Diagnosis not present

## 2019-09-22 DIAGNOSIS — E7849 Other hyperlipidemia: Secondary | ICD-10-CM | POA: Diagnosis not present

## 2019-09-22 DIAGNOSIS — I10 Essential (primary) hypertension: Secondary | ICD-10-CM | POA: Diagnosis not present

## 2019-10-08 DIAGNOSIS — R946 Abnormal results of thyroid function studies: Secondary | ICD-10-CM | POA: Diagnosis not present

## 2019-10-08 DIAGNOSIS — Z13220 Encounter for screening for lipoid disorders: Secondary | ICD-10-CM | POA: Diagnosis not present

## 2019-10-08 DIAGNOSIS — K219 Gastro-esophageal reflux disease without esophagitis: Secondary | ICD-10-CM | POA: Diagnosis not present

## 2019-10-08 DIAGNOSIS — I10 Essential (primary) hypertension: Secondary | ICD-10-CM | POA: Diagnosis not present

## 2019-10-13 DIAGNOSIS — M545 Low back pain: Secondary | ICD-10-CM | POA: Diagnosis not present

## 2019-10-13 DIAGNOSIS — Z9189 Other specified personal risk factors, not elsewhere classified: Secondary | ICD-10-CM | POA: Diagnosis not present

## 2019-10-13 DIAGNOSIS — I10 Essential (primary) hypertension: Secondary | ICD-10-CM | POA: Diagnosis not present

## 2019-10-13 DIAGNOSIS — M170 Bilateral primary osteoarthritis of knee: Secondary | ICD-10-CM | POA: Diagnosis not present

## 2019-10-13 DIAGNOSIS — R4582 Worries: Secondary | ICD-10-CM | POA: Diagnosis not present

## 2019-10-13 DIAGNOSIS — K219 Gastro-esophageal reflux disease without esophagitis: Secondary | ICD-10-CM | POA: Diagnosis not present

## 2019-10-13 DIAGNOSIS — J301 Allergic rhinitis due to pollen: Secondary | ICD-10-CM | POA: Diagnosis not present

## 2019-11-11 DIAGNOSIS — R7401 Elevation of levels of liver transaminase levels: Secondary | ICD-10-CM | POA: Diagnosis not present

## 2019-11-15 DIAGNOSIS — R350 Frequency of micturition: Secondary | ICD-10-CM | POA: Diagnosis not present

## 2019-11-15 DIAGNOSIS — R3915 Urgency of urination: Secondary | ICD-10-CM | POA: Diagnosis not present

## 2019-11-15 DIAGNOSIS — N3090 Cystitis, unspecified without hematuria: Secondary | ICD-10-CM | POA: Diagnosis not present

## 2019-11-22 DIAGNOSIS — M170 Bilateral primary osteoarthritis of knee: Secondary | ICD-10-CM | POA: Diagnosis not present

## 2019-11-22 DIAGNOSIS — K219 Gastro-esophageal reflux disease without esophagitis: Secondary | ICD-10-CM | POA: Diagnosis not present

## 2019-11-22 DIAGNOSIS — I10 Essential (primary) hypertension: Secondary | ICD-10-CM | POA: Diagnosis not present

## 2019-12-28 DIAGNOSIS — R5383 Other fatigue: Secondary | ICD-10-CM | POA: Diagnosis not present

## 2019-12-28 DIAGNOSIS — Z13220 Encounter for screening for lipoid disorders: Secondary | ICD-10-CM | POA: Diagnosis not present

## 2019-12-28 DIAGNOSIS — I10 Essential (primary) hypertension: Secondary | ICD-10-CM | POA: Diagnosis not present

## 2019-12-28 DIAGNOSIS — K2100 Gastro-esophageal reflux disease with esophagitis, without bleeding: Secondary | ICD-10-CM | POA: Diagnosis not present

## 2019-12-28 DIAGNOSIS — R7401 Elevation of levels of liver transaminase levels: Secondary | ICD-10-CM | POA: Diagnosis not present

## 2019-12-31 DIAGNOSIS — R4582 Worries: Secondary | ICD-10-CM | POA: Diagnosis not present

## 2019-12-31 DIAGNOSIS — Z0001 Encounter for general adult medical examination with abnormal findings: Secondary | ICD-10-CM | POA: Diagnosis not present

## 2019-12-31 DIAGNOSIS — K219 Gastro-esophageal reflux disease without esophagitis: Secondary | ICD-10-CM | POA: Diagnosis not present

## 2019-12-31 DIAGNOSIS — I10 Essential (primary) hypertension: Secondary | ICD-10-CM | POA: Diagnosis not present

## 2019-12-31 DIAGNOSIS — J301 Allergic rhinitis due to pollen: Secondary | ICD-10-CM | POA: Diagnosis not present

## 2020-02-17 DIAGNOSIS — Z23 Encounter for immunization: Secondary | ICD-10-CM | POA: Diagnosis not present

## 2020-03-16 DIAGNOSIS — Z23 Encounter for immunization: Secondary | ICD-10-CM | POA: Diagnosis not present

## 2020-03-17 DIAGNOSIS — Z1231 Encounter for screening mammogram for malignant neoplasm of breast: Secondary | ICD-10-CM | POA: Diagnosis not present

## 2020-03-22 DIAGNOSIS — K2100 Gastro-esophageal reflux disease with esophagitis, without bleeding: Secondary | ICD-10-CM | POA: Diagnosis not present

## 2020-03-22 DIAGNOSIS — I10 Essential (primary) hypertension: Secondary | ICD-10-CM | POA: Diagnosis not present

## 2020-03-22 DIAGNOSIS — R7401 Elevation of levels of liver transaminase levels: Secondary | ICD-10-CM | POA: Diagnosis not present

## 2020-03-22 DIAGNOSIS — Z13220 Encounter for screening for lipoid disorders: Secondary | ICD-10-CM | POA: Diagnosis not present

## 2020-03-28 DIAGNOSIS — J301 Allergic rhinitis due to pollen: Secondary | ICD-10-CM | POA: Diagnosis not present

## 2020-03-28 DIAGNOSIS — I10 Essential (primary) hypertension: Secondary | ICD-10-CM | POA: Diagnosis not present

## 2020-03-28 DIAGNOSIS — R4582 Worries: Secondary | ICD-10-CM | POA: Diagnosis not present

## 2020-03-28 DIAGNOSIS — M170 Bilateral primary osteoarthritis of knee: Secondary | ICD-10-CM | POA: Diagnosis not present

## 2020-03-28 DIAGNOSIS — K2100 Gastro-esophageal reflux disease with esophagitis, without bleeding: Secondary | ICD-10-CM | POA: Diagnosis not present

## 2020-03-28 DIAGNOSIS — Z23 Encounter for immunization: Secondary | ICD-10-CM | POA: Diagnosis not present

## 2020-06-14 DIAGNOSIS — K2100 Gastro-esophageal reflux disease with esophagitis, without bleeding: Secondary | ICD-10-CM | POA: Diagnosis not present

## 2020-06-14 DIAGNOSIS — I10 Essential (primary) hypertension: Secondary | ICD-10-CM | POA: Diagnosis not present

## 2020-06-14 DIAGNOSIS — Z13220 Encounter for screening for lipoid disorders: Secondary | ICD-10-CM | POA: Diagnosis not present

## 2020-06-14 DIAGNOSIS — R7401 Elevation of levels of liver transaminase levels: Secondary | ICD-10-CM | POA: Diagnosis not present

## 2020-06-14 DIAGNOSIS — Z1329 Encounter for screening for other suspected endocrine disorder: Secondary | ICD-10-CM | POA: Diagnosis not present

## 2020-06-21 DIAGNOSIS — Z9189 Other specified personal risk factors, not elsewhere classified: Secondary | ICD-10-CM | POA: Diagnosis not present

## 2020-06-21 DIAGNOSIS — I10 Essential (primary) hypertension: Secondary | ICD-10-CM | POA: Diagnosis not present

## 2020-06-21 DIAGNOSIS — Z1212 Encounter for screening for malignant neoplasm of rectum: Secondary | ICD-10-CM | POA: Diagnosis not present

## 2020-06-21 DIAGNOSIS — R4582 Worries: Secondary | ICD-10-CM | POA: Diagnosis not present

## 2020-06-21 DIAGNOSIS — J301 Allergic rhinitis due to pollen: Secondary | ICD-10-CM | POA: Diagnosis not present

## 2020-06-21 DIAGNOSIS — K2100 Gastro-esophageal reflux disease with esophagitis, without bleeding: Secondary | ICD-10-CM | POA: Diagnosis not present

## 2020-06-21 DIAGNOSIS — M170 Bilateral primary osteoarthritis of knee: Secondary | ICD-10-CM | POA: Diagnosis not present

## 2020-07-17 DIAGNOSIS — R932 Abnormal findings on diagnostic imaging of liver and biliary tract: Secondary | ICD-10-CM | POA: Diagnosis not present

## 2020-07-17 DIAGNOSIS — K8020 Calculus of gallbladder without cholecystitis without obstruction: Secondary | ICD-10-CM | POA: Diagnosis not present

## 2020-09-18 DIAGNOSIS — I10 Essential (primary) hypertension: Secondary | ICD-10-CM | POA: Diagnosis not present

## 2020-09-18 DIAGNOSIS — R7401 Elevation of levels of liver transaminase levels: Secondary | ICD-10-CM | POA: Diagnosis not present

## 2020-09-18 DIAGNOSIS — Z13220 Encounter for screening for lipoid disorders: Secondary | ICD-10-CM | POA: Diagnosis not present

## 2020-09-18 DIAGNOSIS — K2100 Gastro-esophageal reflux disease with esophagitis, without bleeding: Secondary | ICD-10-CM | POA: Diagnosis not present

## 2020-09-27 DIAGNOSIS — I10 Essential (primary) hypertension: Secondary | ICD-10-CM | POA: Diagnosis not present

## 2020-09-27 DIAGNOSIS — K2100 Gastro-esophageal reflux disease with esophagitis, without bleeding: Secondary | ICD-10-CM | POA: Diagnosis not present

## 2020-09-27 DIAGNOSIS — M170 Bilateral primary osteoarthritis of knee: Secondary | ICD-10-CM | POA: Diagnosis not present

## 2020-09-27 DIAGNOSIS — R4582 Worries: Secondary | ICD-10-CM | POA: Diagnosis not present

## 2020-09-27 DIAGNOSIS — R748 Abnormal levels of other serum enzymes: Secondary | ICD-10-CM | POA: Diagnosis not present

## 2020-09-27 DIAGNOSIS — J301 Allergic rhinitis due to pollen: Secondary | ICD-10-CM | POA: Diagnosis not present

## 2020-09-28 DIAGNOSIS — Z1159 Encounter for screening for other viral diseases: Secondary | ICD-10-CM | POA: Diagnosis not present

## 2020-09-28 DIAGNOSIS — R5383 Other fatigue: Secondary | ICD-10-CM | POA: Diagnosis not present

## 2020-09-28 DIAGNOSIS — R748 Abnormal levels of other serum enzymes: Secondary | ICD-10-CM | POA: Diagnosis not present

## 2020-10-10 DIAGNOSIS — Z23 Encounter for immunization: Secondary | ICD-10-CM | POA: Diagnosis not present

## 2020-10-16 DIAGNOSIS — R404 Transient alteration of awareness: Secondary | ICD-10-CM | POA: Diagnosis not present

## 2020-10-16 DIAGNOSIS — S0590XA Unspecified injury of unspecified eye and orbit, initial encounter: Secondary | ICD-10-CM | POA: Diagnosis not present

## 2020-10-16 DIAGNOSIS — Z20822 Contact with and (suspected) exposure to covid-19: Secondary | ICD-10-CM | POA: Diagnosis not present

## 2020-10-16 DIAGNOSIS — R059 Cough, unspecified: Secondary | ICD-10-CM | POA: Diagnosis not present

## 2020-10-16 DIAGNOSIS — Z77098 Contact with and (suspected) exposure to other hazardous, chiefly nonmedicinal, chemicals: Secondary | ICD-10-CM | POA: Diagnosis not present

## 2020-10-16 DIAGNOSIS — R110 Nausea: Secondary | ICD-10-CM | POA: Diagnosis not present

## 2020-10-16 DIAGNOSIS — R42 Dizziness and giddiness: Secondary | ICD-10-CM | POA: Diagnosis not present

## 2020-10-16 DIAGNOSIS — I517 Cardiomegaly: Secondary | ICD-10-CM | POA: Diagnosis not present

## 2020-10-16 DIAGNOSIS — R0602 Shortness of breath: Secondary | ICD-10-CM | POA: Diagnosis not present

## 2020-10-16 DIAGNOSIS — R519 Headache, unspecified: Secondary | ICD-10-CM | POA: Diagnosis not present

## 2020-10-16 DIAGNOSIS — T542X4A Toxic effect of corrosive acids and acid-like substances, undetermined, initial encounter: Secondary | ICD-10-CM | POA: Diagnosis not present

## 2020-10-16 DIAGNOSIS — I639 Cerebral infarction, unspecified: Secondary | ICD-10-CM | POA: Diagnosis not present

## 2020-10-16 DIAGNOSIS — Z853 Personal history of malignant neoplasm of breast: Secondary | ICD-10-CM | POA: Diagnosis not present

## 2020-10-16 DIAGNOSIS — Z743 Need for continuous supervision: Secondary | ICD-10-CM | POA: Diagnosis not present

## 2020-10-16 DIAGNOSIS — R55 Syncope and collapse: Secondary | ICD-10-CM | POA: Diagnosis not present

## 2020-10-25 DIAGNOSIS — R5383 Other fatigue: Secondary | ICD-10-CM | POA: Diagnosis not present

## 2020-10-25 DIAGNOSIS — D649 Anemia, unspecified: Secondary | ICD-10-CM | POA: Diagnosis not present

## 2020-10-25 DIAGNOSIS — Z1321 Encounter for screening for nutritional disorder: Secondary | ICD-10-CM | POA: Diagnosis not present

## 2020-10-25 DIAGNOSIS — D519 Vitamin B12 deficiency anemia, unspecified: Secondary | ICD-10-CM | POA: Diagnosis not present

## 2021-01-23 DIAGNOSIS — Z1159 Encounter for screening for other viral diseases: Secondary | ICD-10-CM | POA: Diagnosis not present

## 2021-01-23 DIAGNOSIS — Z13220 Encounter for screening for lipoid disorders: Secondary | ICD-10-CM | POA: Diagnosis not present

## 2021-01-23 DIAGNOSIS — K2100 Gastro-esophageal reflux disease with esophagitis, without bleeding: Secondary | ICD-10-CM | POA: Diagnosis not present

## 2021-01-23 DIAGNOSIS — R5383 Other fatigue: Secondary | ICD-10-CM | POA: Diagnosis not present

## 2021-01-23 DIAGNOSIS — I10 Essential (primary) hypertension: Secondary | ICD-10-CM | POA: Diagnosis not present

## 2021-01-23 DIAGNOSIS — R7401 Elevation of levels of liver transaminase levels: Secondary | ICD-10-CM | POA: Diagnosis not present

## 2021-01-23 DIAGNOSIS — Z1329 Encounter for screening for other suspected endocrine disorder: Secondary | ICD-10-CM | POA: Diagnosis not present

## 2021-01-24 DIAGNOSIS — Z1212 Encounter for screening for malignant neoplasm of rectum: Secondary | ICD-10-CM | POA: Diagnosis not present

## 2021-01-24 DIAGNOSIS — Z0001 Encounter for general adult medical examination with abnormal findings: Secondary | ICD-10-CM | POA: Diagnosis not present

## 2021-01-24 DIAGNOSIS — I10 Essential (primary) hypertension: Secondary | ICD-10-CM | POA: Diagnosis not present

## 2021-01-24 DIAGNOSIS — Z23 Encounter for immunization: Secondary | ICD-10-CM | POA: Diagnosis not present

## 2021-01-24 DIAGNOSIS — R4582 Worries: Secondary | ICD-10-CM | POA: Diagnosis not present

## 2021-01-24 DIAGNOSIS — Z9189 Other specified personal risk factors, not elsewhere classified: Secondary | ICD-10-CM | POA: Diagnosis not present

## 2021-01-24 DIAGNOSIS — B351 Tinea unguium: Secondary | ICD-10-CM | POA: Diagnosis not present

## 2021-01-24 DIAGNOSIS — Z5181 Encounter for therapeutic drug level monitoring: Secondary | ICD-10-CM | POA: Diagnosis not present

## 2021-01-24 DIAGNOSIS — M545 Low back pain, unspecified: Secondary | ICD-10-CM | POA: Diagnosis not present

## 2021-01-24 DIAGNOSIS — R748 Abnormal levels of other serum enzymes: Secondary | ICD-10-CM | POA: Diagnosis not present

## 2021-02-14 DIAGNOSIS — Z23 Encounter for immunization: Secondary | ICD-10-CM | POA: Diagnosis not present

## 2021-02-15 DIAGNOSIS — H40013 Open angle with borderline findings, low risk, bilateral: Secondary | ICD-10-CM | POA: Diagnosis not present

## 2021-04-09 DIAGNOSIS — R300 Dysuria: Secondary | ICD-10-CM | POA: Diagnosis not present

## 2021-05-07 DIAGNOSIS — R7401 Elevation of levels of liver transaminase levels: Secondary | ICD-10-CM | POA: Diagnosis not present

## 2021-05-07 DIAGNOSIS — Z13220 Encounter for screening for lipoid disorders: Secondary | ICD-10-CM | POA: Diagnosis not present

## 2021-05-07 DIAGNOSIS — I10 Essential (primary) hypertension: Secondary | ICD-10-CM | POA: Diagnosis not present

## 2021-05-07 DIAGNOSIS — K2100 Gastro-esophageal reflux disease with esophagitis, without bleeding: Secondary | ICD-10-CM | POA: Diagnosis not present

## 2021-05-08 DIAGNOSIS — Z7189 Other specified counseling: Secondary | ICD-10-CM | POA: Diagnosis not present

## 2021-05-08 DIAGNOSIS — R4582 Worries: Secondary | ICD-10-CM | POA: Diagnosis not present

## 2021-05-08 DIAGNOSIS — B351 Tinea unguium: Secondary | ICD-10-CM | POA: Diagnosis not present

## 2021-05-08 DIAGNOSIS — I10 Essential (primary) hypertension: Secondary | ICD-10-CM | POA: Diagnosis not present

## 2021-05-08 DIAGNOSIS — K2100 Gastro-esophageal reflux disease with esophagitis, without bleeding: Secondary | ICD-10-CM | POA: Diagnosis not present

## 2021-05-08 DIAGNOSIS — R748 Abnormal levels of other serum enzymes: Secondary | ICD-10-CM | POA: Diagnosis not present

## 2021-05-08 DIAGNOSIS — M170 Bilateral primary osteoarthritis of knee: Secondary | ICD-10-CM | POA: Diagnosis not present

## 2021-05-14 DIAGNOSIS — Z1231 Encounter for screening mammogram for malignant neoplasm of breast: Secondary | ICD-10-CM | POA: Diagnosis not present

## 2021-05-29 DIAGNOSIS — J40 Bronchitis, not specified as acute or chronic: Secondary | ICD-10-CM | POA: Diagnosis not present

## 2021-08-02 DIAGNOSIS — M810 Age-related osteoporosis without current pathological fracture: Secondary | ICD-10-CM | POA: Diagnosis not present

## 2021-08-09 DIAGNOSIS — R5383 Other fatigue: Secondary | ICD-10-CM | POA: Diagnosis not present

## 2021-08-09 DIAGNOSIS — I10 Essential (primary) hypertension: Secondary | ICD-10-CM | POA: Diagnosis not present

## 2021-08-09 DIAGNOSIS — Z13220 Encounter for screening for lipoid disorders: Secondary | ICD-10-CM | POA: Diagnosis not present

## 2021-08-09 DIAGNOSIS — R7401 Elevation of levels of liver transaminase levels: Secondary | ICD-10-CM | POA: Diagnosis not present

## 2021-08-09 DIAGNOSIS — K2100 Gastro-esophageal reflux disease with esophagitis, without bleeding: Secondary | ICD-10-CM | POA: Diagnosis not present

## 2021-08-09 DIAGNOSIS — Z1329 Encounter for screening for other suspected endocrine disorder: Secondary | ICD-10-CM | POA: Diagnosis not present

## 2021-08-16 DIAGNOSIS — M810 Age-related osteoporosis without current pathological fracture: Secondary | ICD-10-CM | POA: Diagnosis not present

## 2021-08-16 DIAGNOSIS — K2100 Gastro-esophageal reflux disease with esophagitis, without bleeding: Secondary | ICD-10-CM | POA: Diagnosis not present

## 2021-08-16 DIAGNOSIS — R748 Abnormal levels of other serum enzymes: Secondary | ICD-10-CM | POA: Diagnosis not present

## 2021-08-16 DIAGNOSIS — M170 Bilateral primary osteoarthritis of knee: Secondary | ICD-10-CM | POA: Diagnosis not present

## 2021-08-16 DIAGNOSIS — M545 Low back pain, unspecified: Secondary | ICD-10-CM | POA: Diagnosis not present

## 2021-08-16 DIAGNOSIS — I10 Essential (primary) hypertension: Secondary | ICD-10-CM | POA: Diagnosis not present

## 2021-08-16 DIAGNOSIS — R4582 Worries: Secondary | ICD-10-CM | POA: Diagnosis not present

## 2021-11-08 DIAGNOSIS — Z13220 Encounter for screening for lipoid disorders: Secondary | ICD-10-CM | POA: Diagnosis not present

## 2021-11-08 DIAGNOSIS — R748 Abnormal levels of other serum enzymes: Secondary | ICD-10-CM | POA: Diagnosis not present

## 2021-11-08 DIAGNOSIS — R7401 Elevation of levels of liver transaminase levels: Secondary | ICD-10-CM | POA: Diagnosis not present

## 2021-11-15 DIAGNOSIS — R748 Abnormal levels of other serum enzymes: Secondary | ICD-10-CM | POA: Diagnosis not present

## 2021-11-15 DIAGNOSIS — R4582 Worries: Secondary | ICD-10-CM | POA: Diagnosis not present

## 2021-11-15 DIAGNOSIS — G6289 Other specified polyneuropathies: Secondary | ICD-10-CM | POA: Diagnosis not present

## 2021-11-15 DIAGNOSIS — I10 Essential (primary) hypertension: Secondary | ICD-10-CM | POA: Diagnosis not present

## 2021-11-15 DIAGNOSIS — M170 Bilateral primary osteoarthritis of knee: Secondary | ICD-10-CM | POA: Diagnosis not present

## 2021-11-15 DIAGNOSIS — K2100 Gastro-esophageal reflux disease with esophagitis, without bleeding: Secondary | ICD-10-CM | POA: Diagnosis not present

## 2021-12-20 ENCOUNTER — Encounter: Payer: Self-pay | Admitting: Internal Medicine

## 2022-02-06 DIAGNOSIS — R5383 Other fatigue: Secondary | ICD-10-CM | POA: Diagnosis not present

## 2022-02-06 DIAGNOSIS — D649 Anemia, unspecified: Secondary | ICD-10-CM | POA: Diagnosis not present

## 2022-02-06 DIAGNOSIS — I10 Essential (primary) hypertension: Secondary | ICD-10-CM | POA: Diagnosis not present

## 2022-02-06 DIAGNOSIS — Z1329 Encounter for screening for other suspected endocrine disorder: Secondary | ICD-10-CM | POA: Diagnosis not present

## 2022-02-06 DIAGNOSIS — Z13220 Encounter for screening for lipoid disorders: Secondary | ICD-10-CM | POA: Diagnosis not present

## 2022-02-06 DIAGNOSIS — Z0001 Encounter for general adult medical examination with abnormal findings: Secondary | ICD-10-CM | POA: Diagnosis not present

## 2022-02-12 DIAGNOSIS — K2100 Gastro-esophageal reflux disease with esophagitis, without bleeding: Secondary | ICD-10-CM | POA: Diagnosis not present

## 2022-02-12 DIAGNOSIS — Z0001 Encounter for general adult medical examination with abnormal findings: Secondary | ICD-10-CM | POA: Diagnosis not present

## 2022-02-12 DIAGNOSIS — M170 Bilateral primary osteoarthritis of knee: Secondary | ICD-10-CM | POA: Diagnosis not present

## 2022-02-12 DIAGNOSIS — R4582 Worries: Secondary | ICD-10-CM | POA: Diagnosis not present

## 2022-02-12 DIAGNOSIS — J301 Allergic rhinitis due to pollen: Secondary | ICD-10-CM | POA: Diagnosis not present

## 2022-02-12 DIAGNOSIS — I10 Essential (primary) hypertension: Secondary | ICD-10-CM | POA: Diagnosis not present

## 2022-02-12 DIAGNOSIS — G6289 Other specified polyneuropathies: Secondary | ICD-10-CM | POA: Diagnosis not present

## 2022-02-12 DIAGNOSIS — M545 Low back pain, unspecified: Secondary | ICD-10-CM | POA: Diagnosis not present
